# Patient Record
Sex: Male | Born: 1975 | Race: Black or African American | Hispanic: No | Marital: Single | Smoking: Current every day smoker
Health system: Southern US, Community
[De-identification: ages and names within clinical notes are randomized; demographics above are authoritative.]

---

## 1998-11-22 ENCOUNTER — Inpatient Hospital Stay (HOSPITAL_COMMUNITY): Admission: AD | Admit: 1998-11-22 | Discharge: 1998-11-25 | Payer: Self-pay | Admitting: *Deleted

## 1998-12-20 ENCOUNTER — Emergency Department (HOSPITAL_COMMUNITY): Admission: EM | Admit: 1998-12-20 | Discharge: 1998-12-20 | Payer: Self-pay | Admitting: Emergency Medicine

## 2000-06-20 ENCOUNTER — Encounter: Payer: Self-pay | Admitting: Emergency Medicine

## 2000-06-20 ENCOUNTER — Emergency Department (HOSPITAL_COMMUNITY): Admission: EM | Admit: 2000-06-20 | Discharge: 2000-06-20 | Payer: Self-pay | Admitting: Emergency Medicine

## 2009-11-09 ENCOUNTER — Inpatient Hospital Stay (HOSPITAL_COMMUNITY): Admission: EM | Admit: 2009-11-09 | Discharge: 2009-11-10 | Payer: Self-pay | Admitting: Emergency Medicine

## 2010-02-08 ENCOUNTER — Emergency Department (HOSPITAL_COMMUNITY): Admission: EM | Admit: 2010-02-08 | Discharge: 2010-02-09 | Payer: Self-pay | Admitting: Emergency Medicine

## 2010-07-02 ENCOUNTER — Emergency Department (HOSPITAL_COMMUNITY): Admission: EM | Admit: 2010-07-02 | Discharge: 2010-07-02 | Payer: Self-pay | Admitting: Emergency Medicine

## 2010-08-18 ENCOUNTER — Emergency Department (HOSPITAL_COMMUNITY)
Admission: EM | Admit: 2010-08-18 | Discharge: 2010-08-18 | Payer: Self-pay | Source: Home / Self Care | Admitting: Emergency Medicine

## 2010-10-31 LAB — BASIC METABOLIC PANEL
BUN: 11 mg/dL (ref 6–23)
CO2: 27 mEq/L (ref 19–32)
Calcium: 9.6 mg/dL (ref 8.4–10.5)
Chloride: 103 mEq/L (ref 96–112)
Creatinine, Ser: 1.14 mg/dL (ref 0.4–1.5)
GFR calc Af Amer: >60 mL/min (ref >60)
GFR calc non Af Amer: >60 mL/min (ref >60)
Glucose, Bld: 85 mg/dL (ref 70–99)
Potassium: 4.3 mEq/L (ref 3.5–5.1)
Sodium: 138 mEq/L (ref 135–145)

## 2010-10-31 LAB — DIFFERENTIAL
Basophils Absolute: 0 10*3/uL (ref 0.0–0.1)
Basophils Relative: 1 % (ref 0–1)
Eosinophils Absolute: 0.1 10*3/uL (ref 0.0–0.7)
Eosinophils Relative: 1 % (ref 0–5)
Lymphocytes Relative: 30 % (ref 12–46)
Lymphs Abs: 1.7 10*3/uL (ref 0.7–4.0)
Monocytes Absolute: 0.3 10*3/uL (ref 0.1–1.0)
Monocytes Relative: 6 % (ref 3–12)
Neutro Abs: 3.5 10*3/uL (ref 1.7–7.7)
Neutrophils Relative %: 62 % (ref 43–77)

## 2010-10-31 LAB — CBC
HCT: 38.6 % — ABNORMAL LOW (ref 39.0–52.0)
Hemoglobin: 13.3 g/dL (ref 13.0–17.0)
MCH: 33.9 pg (ref 26.0–34.0)
MCHC: 34.4 g/dL (ref 30.0–36.0)
MCV: 98.5 fL (ref 78.0–100.0)
Platelets: 174 10*3/uL (ref 150–400)
RBC: 3.92 MIL/uL — ABNORMAL LOW (ref 4.22–5.81)
RDW: 11.9 % (ref 11.5–15.5)
WBC: 5.7 10*3/uL (ref 4.0–10.5)

## 2010-10-31 LAB — ETHANOL: Alcohol, Ethyl (B): <5 mg/dL (ref 0–10)

## 2010-10-31 LAB — RAPID URINE DRUG SCREEN, HOSP PERFORMED
Amphetamines: NOT DETECTED
Barbiturates: NOT DETECTED
Benzodiazepines: NOT DETECTED
Cocaine: POSITIVE — AB
Opiates: NOT DETECTED
Tetrahydrocannabinol: POSITIVE — AB

## 2010-10-31 LAB — TRICYCLICS SCREEN, URINE: TCA Scrn: NOT DETECTED

## 2010-11-07 LAB — URINE DRUGS OF ABUSE SCREEN W ALC, ROUTINE (REF LAB)
Amphetamine Screen, Ur: NEGATIVE
Barbiturate Quant, Ur: NEGATIVE
Benzodiazepines.: NEGATIVE
Cocaine Metabolites: NEGATIVE
Creatinine,U: 209.2 mg/dL
Ethyl Alcohol: <10 mg/dL (ref <10)
Marijuana Metabolite: POSITIVE — AB
Methadone: NEGATIVE
Opiate Screen, Urine: NEGATIVE
Phencyclidine (PCP): NEGATIVE
Propoxyphene: NEGATIVE

## 2010-11-07 LAB — COMPREHENSIVE METABOLIC PANEL
ALT: 16 U/L (ref 0–53)
AST: 22 U/L (ref 0–37)
Albumin: 4 g/dL (ref 3.5–5.2)
Alkaline Phosphatase: 54 U/L (ref 39–117)
BUN: 8 mg/dL (ref 6–23)
CO2: 24 mEq/L (ref 19–32)
Calcium: 9.5 mg/dL (ref 8.4–10.5)
Chloride: 105 mEq/L (ref 96–112)
Creatinine, Ser: 1.03 mg/dL (ref 0.4–1.5)
GFR calc Af Amer: >60 mL/min (ref >60)
GFR calc non Af Amer: >60 mL/min (ref >60)
Glucose, Bld: 141 mg/dL — ABNORMAL HIGH (ref 70–99)
Potassium: 4 mEq/L (ref 3.5–5.1)
Sodium: 136 mEq/L (ref 135–145)
Total Bilirubin: 0.7 mg/dL (ref 0.3–1.2)
Total Protein: 7.2 g/dL (ref 6.0–8.3)

## 2010-11-07 LAB — CBC
HCT: 40 % (ref 39.0–52.0)
Hemoglobin: 13.5 g/dL (ref 13.0–17.0)
MCHC: 33.7 g/dL (ref 30.0–36.0)
MCV: 99.8 fL (ref 78.0–100.0)
Platelets: 195 10*3/uL (ref 150–400)
RBC: 4.01 MIL/uL — ABNORMAL LOW (ref 4.22–5.81)
RDW: 12.2 % (ref 11.5–15.5)
WBC: 6.1 10*3/uL (ref 4.0–10.5)

## 2010-11-07 LAB — CARDIAC PANEL(CRET KIN+CKTOT+MB+TROPI)
CK, MB: 0.7 ng/mL (ref 0.3–4.0)
Relative Index: 0.5 (ref 0.0–2.5)
Total CK: 153 U/L (ref 7–232)
Troponin I: <0.01 ng/mL (ref 0.00–0.06)

## 2010-11-07 LAB — TSH: TSH: 0.876 u[IU]/mL (ref 0.350–4.500)

## 2010-11-07 LAB — THC (MARIJUANA), URINE, CONFIRMATION: Marijuana, Ur-Confirmation: 24 NG/ML — ABNORMAL HIGH

## 2014-08-07 ENCOUNTER — Emergency Department (HOSPITAL_COMMUNITY)
Admission: EM | Admit: 2014-08-07 | Discharge: 2014-08-07 | Disposition: A | Discharge status: Home / Self Care | Payer: Self-pay | Attending: Emergency Medicine | Admitting: Emergency Medicine

## 2014-08-07 ENCOUNTER — Encounter (HOSPITAL_COMMUNITY): Payer: Self-pay | Admitting: *Deleted

## 2014-08-07 ENCOUNTER — Emergency Department (HOSPITAL_COMMUNITY): Payer: Self-pay

## 2014-08-07 DIAGNOSIS — R1012 Left upper quadrant pain: Secondary | ICD-10-CM | POA: Insufficient documentation

## 2014-08-07 DIAGNOSIS — Z720 Tobacco use: Secondary | ICD-10-CM | POA: Insufficient documentation

## 2014-08-07 DIAGNOSIS — R079 Chest pain, unspecified: Secondary | ICD-10-CM | POA: Insufficient documentation

## 2014-08-07 DIAGNOSIS — R200 Anesthesia of skin: Secondary | ICD-10-CM | POA: Insufficient documentation

## 2014-08-07 DIAGNOSIS — H538 Other visual disturbances: Secondary | ICD-10-CM | POA: Insufficient documentation

## 2014-08-07 DIAGNOSIS — H539 Unspecified visual disturbance: Secondary | ICD-10-CM

## 2014-08-07 LAB — BASIC METABOLIC PANEL
ANION GAP: 9 (ref 5–15)
BUN: 11 mg/dL (ref 6–23)
CO2: 24 mmol/L (ref 19–32)
Calcium: 9.2 mg/dL (ref 8.4–10.5)
Chloride: 103 mEq/L (ref 96–112)
Creatinine, Ser: 1.04 mg/dL (ref 0.50–1.35)
Glucose, Bld: 95 mg/dL (ref 70–99)
POTASSIUM: 3.9 mmol/L (ref 3.5–5.1)
Sodium: 136 mmol/L (ref 135–145)

## 2014-08-07 LAB — CBC
HCT: 37.7 % — ABNORMAL LOW (ref 39.0–52.0)
Hemoglobin: 12.6 g/dL — ABNORMAL LOW (ref 13.0–17.0)
MCH: 31 pg (ref 26.0–34.0)
MCHC: 33.4 g/dL (ref 30.0–36.0)
MCV: 92.6 fL (ref 78.0–100.0)
Platelets: 188 10*3/uL (ref 150–400)
RBC: 4.07 MIL/uL — ABNORMAL LOW (ref 4.22–5.81)
RDW: 11.5 % (ref 11.5–15.5)
WBC: 4.6 10*3/uL (ref 4.0–10.5)

## 2014-08-07 LAB — I-STAT TROPONIN, ED
Troponin i, poc: 0 ng/mL (ref 0.00–0.08)
Troponin i, poc: 0 ng/mL (ref 0.00–0.08)

## 2014-08-07 LAB — POC OCCULT BLOOD, ED: Fecal Occult Bld: NEGATIVE

## 2014-08-07 MED ORDER — METHOCARBAMOL 500 MG PO TABS
500.0000 mg | ORAL_TABLET | Freq: Two times a day (BID) | ORAL | Status: DC
Start: 2014-08-07 — End: 2023-11-05

## 2014-08-07 MED ORDER — IOHEXOL 350 MG/ML SOLN
80.0000 mL | Freq: Once | INTRAVENOUS | Status: AC | PRN
  Administered 2014-08-07: 100 mL via INTRAVENOUS

## 2014-08-07 MED ORDER — OMEPRAZOLE 20 MG PO CPDR: 20.0000 mg | DELAYED_RELEASE_CAPSULE | Freq: Every day | ORAL | Status: DC

## 2014-08-07 NOTE — ED Notes (Signed)
Pt requesting food and drink; informed patient of NPO status until CT has resulted

## 2014-08-07 NOTE — ED Notes (Signed)
Pt to ED via [facility] c/o dizziness and chest pain. Pt was walking from the store when he began feeling dizzy and sharp chest pains simultaneously. Reports similar episode two weeks ago, relieved with aspirin. EMS gave 324mg  asa and Nitrox 2; pt denies pain at this time. EMS VS 127/70; hr 76; cbg 113
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
## 2014-08-07 NOTE — Discharge Instructions (Signed)
1. Medications: robaxin for muscle spasms, usual home medications 2. Treatment: rest, drink plenty of fluids, gentle stretching 3. Follow Up: Please followup with your primary doctor in 3 days for discussion of your diagnoses and further evaluation after today's visit; if you do not have a primary care doctor use the resource guide provided to find one; Please return to the ER for return of CP, or other concerning symptoms   Chest Pain (Nonspecific) It is often hard to give a specific diagnosis for the cause of chest pain. There is always a chance that your pain could be related to something serious, such as a heart attack or a blood clot in the lungs. You need to follow up with your health care provider for further evaluation. CAUSES   Heartburn.  Pneumonia or bronchitis.  Anxiety or stress.  Inflammation around your heart (pericarditis) or lung (pleuritis or pleurisy).  A blood clot in the lung.  A collapsed lung (pneumothorax). It can develop suddenly on its own (spontaneous pneumothorax) or from trauma to the chest.  Shingles infection (herpes zoster virus). The chest wall is composed of bones, muscles, and cartilage. Any of these can be the source of the pain.  The bones can be bruised by injury.  The muscles or cartilage can be strained by coughing or overwork.  The cartilage can be affected by inflammation and become sore (costochondritis). DIAGNOSIS  Lab tests or other studies may be needed to find the cause of your pain. Your health care provider may have you take a test called an ambulatory electrocardiogram (ECG). An ECG records your heartbeat patterns over a 24-hour period. You may also have other tests, such as:  Transthoracic echocardiogram (TTE). During echocardiography, sound waves are used to evaluate how blood flows through your heart.  Transesophageal echocardiogram (TEE).  Cardiac monitoring. This allows your health care provider to monitor your heart rate and  rhythm in real time.  Holter monitor. This is a portable device that records your heartbeat and can help diagnose heart arrhythmias. It allows your health care provider to track your heart activity for several days, if needed.  Stress tests by exercise or by giving medicine that makes the heart beat faster. TREATMENT   Treatment depends on what may be causing your chest pain. Treatment may include:  Acid blockers for heartburn.  Anti-inflammatory medicine.  Pain medicine for inflammatory conditions.  Antibiotics if an infection is present.  You may be advised to change lifestyle habits. This includes stopping smoking and avoiding alcohol, caffeine, and chocolate.  You may be advised to keep your head raised (elevated) when sleeping. This reduces the chance of acid going backward from your stomach into your esophagus. Most of the time, nonspecific chest pain will improve within 2-3 days with rest and mild pain medicine.  HOME CARE INSTRUCTIONS   If antibiotics were prescribed, take them as directed. Finish them even if you start to feel better.  For the next few days, avoid physical activities that bring on chest pain. Continue physical activities as directed.  Do not use any tobacco products, including cigarettes, chewing tobacco, or electronic cigarettes.  Avoid drinking alcohol.  Only take medicine as directed by your health care provider.  Follow your health care provider's suggestions for further testing if your chest pain does not go away.  Keep any follow-up appointments you made. If you do not go to an appointment, you could develop lasting (chronic) problems with pain. If there is any problem keeping an appointment,  call to reschedule. SEEK MEDICAL CARE IF:   Your chest pain does not go away, even after treatment.  You have a rash with blisters on your chest.  You have a fever. SEEK IMMEDIATE MEDICAL CARE IF:   You have increased chest pain or pain that spreads to  your arm, neck, jaw, back, or abdomen.  You have shortness of breath.  You have an increasing cough, or you cough up blood.  You have severe back or abdominal pain.  You feel nauseous or vomit.  You have severe weakness.  You faint.  You have chills. This is an emergency. Do not wait to see if the pain will go away. Get medical help at once. Call your local emergency services (911 in U.S.). Do not drive yourself to the hospital. MAKE SURE YOU:   Understand these instructions.  Will watch your condition.  Will get help right away if you are not doing well or get worse. Document Released: 05/10/2005 Document Revised: 08/05/2013 Document Reviewed: 03/05/2008 [facility] Patient Information 2015 [place], [place]. This information is not intended to replace advice given to you by your health care provider. Make sure you discuss any questions you have with your health care provider.   Emergency Department Resource Guide 1) Find a Doctor and Pay Out of Pocket Although you won't have to find out who is covered by your insurance plan, it is a good idea to ask around and get recommendations. You will then need to call the office and see if the doctor you have chosen will accept you as a new patient and what types of options they offer for patients who are self-pay. Some doctors offer discounts or will set up payment plans for their patients who do not have insurance, but you will need to ask so you aren't surprised when you get to your appointment.  2) Contact Your Local Health Department Not all health departments have doctors that can see patients for sick visits, but many do, so it is worth a call to see if yours does. If you don't know where your local health department is, you can check in your phone book. The CDC also has a tool to help you locate your state's health department, and many state websites also have listings of all of their local health departments.  3) Find a Walk-in  Clinic If your illness is not likely to be very severe or complicated, you may want to try a walk in clinic. These are popping up all over the country in pharmacies, drugstores, and shopping centers. They're usually staffed by nurse practitioners or physician assistants that have been trained to treat common illnesses and complaints. They're usually fairly quick and inexpensive. However, if you have serious medical issues or chronic medical problems, these are probably not your best option.  No Primary Care Doctor: - Call Health Connect at  [phone] - they can help you locate a primary care doctor that  accepts your insurance, provides certain services, etc. - Physician Referral Service- [phone]  Chronic Pain Problems: Organization         Address  Phone   Notes  [facility]  [phone] Patients need to be referred by their primary care doctor.   Medication Assistance: Organization         Address  Phone   Notes  [facility] Medication [facility] [address] [place] [place], [place] [address] [phone] --Must be a resident of [facility] --  Must have NO insurance coverage whatsoever (no Medicaid/ Medicare, etc.) -- The pt. MUST have a primary care doctor that directs their care regularly and follows them in the community   Med[facility]ist  [phone]) [phone]   [employer]  [phone]    Agencies that provide inexpensive medical care: Organization         Address  Phone   Notes  [facility]  [phone]   [facility]    [phone]   [facility] [address] [place], [place] [id number] [phone]   [facility] [place] [address], [place] [phone]) [phone]   Planned Parenthood    [phone]   [facility]    [phone]   [facility] and [facility]  [address], [place] Phone:  [phone], Fax:  [phone]) [phone] Hours  of Operation:  9 am - 6 pm, M-F.  Also accepts Medicaid/Medicare and self-pay.  [facility]  [address], [place] Phone: [phone]) [phone], Fax: [phone]. Hours of Operation:  8:30 am - 5:30 pm, M-F.  Also accepts Medicaid and self-pay.  [facility] [place] [address], [place] Phone: [phone]) [phone]   [facility] [address], [place], [place] [phone], Ext. [phone] Mondays & Thursdays: 7-9 AM.  First 15 patients are seen on a first come, first serve basis.    Medicaid-accepting [facility] Providers:  Organization         Address  Phone   Notes  [facility] [address], [place] [phone]) [phone] Also accepts self-pay patients.  [facility] [address] [place], [place]  [phone]   [facility] [address], [place] [phone]   [facility] [address] [place] [address], [place] [phone]   Renaye RakersVeita Bland [address], [place]   [phone]) [phone] Only accepts [place] Access [place] patients after they have their name applied to their card.   Self-Pay (no insurance) in [facility]:  Organization         Address  Phone   Notes  Sickle Cell Patients, [facility] [address], [place] [phone]   [facility] [address], [place] [phone]   [facility] [place]  [address], [place] [phone]   [facility]/Dr. Osei-Bonsu  [address] [place] [address], [place] or [phone], [place] [phone] Phone number for both [place] and [place] locations is the same.  [facility] [address], [place] [phone]   [facility]  [address] [place] [address], [place] or [address] [phone]   [facility] [address], [place] [phone]) [phone], phone; [phone], fax Sees patients 1st and 3rd Saturday of every month.  Must not qualify for public or private insurance (i.e. Medicaid, Medicare, [place] Health Choice, Veterans' Benefits)  Household income should be no more than 200% of the poverty level The clinic cannot treat you if you are pregnant or think you are pregnant  Sexually transmitted diseases are not treated at the clinic.    Dental Care: Organization         Address  Phone  Notes  [facility] Department of [facility] [address] [place], [place] [phone] Accepts children up to age 69 who are enrolled in [place] or [place] Health Choice; pregnant women with a Medicaid card; and children who have applied for Medicaid or [place] Health Choice, but were declined, whose parents can pay a reduced fee at time of service.  [facility] Department of [facility]  [address], [place] [phone] Accepts children up to age 62 who are enrolled in [place] or [place] Health Choice; pregnant women with a Medicaid card; and children who have applied for Medicaid or [place] Health Choice, but were declined, whose parents can pay a reduced fee at time of service.  [facility]  [address] [place], [place] [phone] Patients are seen by appointment only. Walk-ins are not accepted. [facility] will see patients 30 years of age and older. Monday - Tuesday (8am-5pm) Most Wednesdays (8:30-5pm) $30 per visit, cash only  [facility] Access [facility]  [address], [facility] [phone] Patients are seen by appointment only. Walk-ins are not accepted. [facility] will see patients 9 years of age and older. One Wednesday Evening (Monthly: Volunteer Based).  $30 per visit, cash only  [employer]  [phone] for adults; Children under age 73, call Graduate  Pediatric Dentistry at [phone]. Children aged 52-14, please call [phone] to request a pediatric application.  Dental services are provided in all areas of dental care including fillings, crowns and bridges, complete and partial dentures, implants, gum treatment, root canals, and extractions. Preventive care is also provided. Treatment is provided to both adults and children. Patients are selected via a lottery and there is often a waiting list.   [facility] [address], [place]  [phone] [email or web address]   [facility] [address] [place], [place] [phone], Ext. [phone] Second and Fourth Thursday of each month, opens at 6:30 AM; Clinic ends at 9 AM.  Patients are seen on a first-come first-served basis, and a limited number are seen during each clinic.   [facility]  [address] [place], [place] [phone]   Eligibility Requirements You must have lived in [place], [place], or [place] counties for at least the last three months.   You cannot be eligible for state or federal sponsored [place], including [employer], [place], or [employer].   You generally cannot be eligible for healthcare insurance through your employer.    How to apply: Eligibility screenings are held every Tuesday and Wednesday afternoon from 1:00 pm until 4:00 pm. You do not need an appointment for the interview!  [facility] [address] [place], [place] [phone]   [facility] Health Department  [phone]   [facility] Health Department  [phone]   [facility] Health Department  [phone]    Behavioral Health Resources in the Community: Intensive Outpatient Programs Organization         Address  Phone  Notes  [facility] [address], [place], [place] [phone]   [facility] Outpatient [address] [place], [place]  [phone]   ADS: Alcohol [facility] Drug Svcs [address] [place], [place]  [phone]   [facility] [address],  [place], [place] [phone] or [phone]   Substance Abuse Resources Organization  Address  Phone  Notes  Alcohol and Drug Services  [phone]   [place]  [phone]   [facility] [place]  [phone]   Chinita Pester  [phone]   Residential & Outpatient Substance Abuse Program  [phone]   Psychological Services Organization         Address  Phone  Notes  [facility]  [place]  [phone]   [place] [address] [place] or [phone]    Mobile Crisis Teams Organization         Address  Phone  Notes  [facility], Mobile Crisis Care Unit  [phone]   [facility]  [address] [place], [place]   [place] Levels [address] [place] [phone]    Self-Help/Support Groups Organization         Address  Phone             Notes  [place] of [place] - variety of support groups  [place] Call for more information  Narcotics Anonymous (NA), [facility] [address], [employer] [place]  2 meetings at this location   Special educational needs teacher         Address  Phone  Notes  ASAP Residential Treatment [place],    [place]  [phone]   [facility]  [address], [place] [address], [place], [place]   [place], [place] [phone] Admissions: 8am-3pm M-F  Incentives Substance [place] [address]    [place], [place] [phone]   [facility] [address] [place], [place], [place]   [facility] [address]  [place], [place]   Insight Programs - Intensive Outpatient [address] Kristeen Mans [address], [place], [place]   [facility] [address]  [place], [place] [phone] or [phone]   [facility] (RTS) [address], [place], [place] Accepts Medicaid  [facility] [place] [address],  [place] [phone] Substance Abuse/Addiction Treatment   [facility] Organization         Address  Phone  Notes  [facility]  [phone]   Domenic Schwab, PhD [address] Arlis [place], [place]   2395[address]23[address] or [phone]   [place], [place] [phone]   [facility] [address] [place], [place] [phone] Insurance/Medicaid/sponsorship through [facility] and Families [address] [place]                                    [place], [place] [phone] Rafael Hernandez [address], [place] [phone]    Dr. Adele Schilder  [phone]   [facility]. 1) [address], [place] 2) [place] 3)  [place] [address], [place] [phone]  [phone]   [place] [phone] or [phone] (After Hours)

## 2014-08-23 ENCOUNTER — Emergency Department (HOSPITAL_COMMUNITY)
Admission: EM | Admit: 2014-08-23 | Discharge: 2014-08-23 | Disposition: A | Discharge status: Home / Self Care | Payer: Self-pay

## 2014-08-24 ENCOUNTER — Emergency Department (HOSPITAL_COMMUNITY)
Admission: EM | Admit: 2014-08-24 | Discharge: 2014-08-25 | Disposition: A | Discharge status: Other Acute Inpatient Hospital | Payer: Self-pay | Attending: Emergency Medicine | Admitting: Emergency Medicine

## 2014-08-24 ENCOUNTER — Encounter (HOSPITAL_COMMUNITY): Payer: Self-pay | Admitting: Emergency Medicine

## 2014-08-24 DIAGNOSIS — Z720 Tobacco use: Secondary | ICD-10-CM | POA: Insufficient documentation

## 2014-08-24 DIAGNOSIS — R45851 Suicidal ideations: Secondary | ICD-10-CM

## 2014-08-24 DIAGNOSIS — F200 Paranoid schizophrenia: Secondary | ICD-10-CM | POA: Diagnosis present

## 2014-08-24 DIAGNOSIS — Z9119 Patient's noncompliance with other medical treatment and regimen: Secondary | ICD-10-CM | POA: Insufficient documentation

## 2014-08-24 LAB — ETHANOL: Alcohol, Ethyl (B): <5 mg/dL (ref 0–9)

## 2014-08-24 LAB — CBC WITH DIFFERENTIAL/PLATELET
BASOS ABS: 0 10*3/uL (ref 0.0–0.1)
Basophils Relative: 0 % (ref 0–1)
EOS ABS: 0 10*3/uL (ref 0.0–0.7)
Eosinophils Relative: 0 % (ref 0–5)
HCT: 41.8 % (ref 39.0–52.0)
HEMOGLOBIN: 14.1 g/dL (ref 13.0–17.0)
Lymphocytes Relative: 36 % (ref 12–46)
Lymphs Abs: 1.9 10*3/uL (ref 0.7–4.0)
MCH: 32 pg (ref 26.0–34.0)
MCHC: 33.7 g/dL (ref 30.0–36.0)
MCV: 94.8 fL (ref 78.0–100.0)
MONO ABS: 0.3 10*3/uL (ref 0.1–1.0)
MONOS PCT: 5 % (ref 3–12)
Neutro Abs: 3 10*3/uL (ref 1.7–7.7)
Neutrophils Relative %: 59 % (ref 43–77)
Platelets: 179 10*3/uL (ref 150–400)
RBC: 4.41 MIL/uL (ref 4.22–5.81)
RDW: 12.5 % (ref 11.5–15.5)
WBC: 5.2 10*3/uL (ref 4.0–10.5)

## 2014-08-24 LAB — RAPID URINE DRUG SCREEN, HOSP PERFORMED
Amphetamines: NOT DETECTED
BENZODIAZEPINES: NOT DETECTED
Barbiturates: NOT DETECTED
Cocaine: NOT DETECTED
Opiates: NOT DETECTED
Tetrahydrocannabinol: NOT DETECTED

## 2014-08-24 LAB — I-STAT CHEM 8, ED
BUN: 14 mg/dL (ref 6–23)
Calcium, Ion: 1.21 mmol/L (ref 1.12–1.23)
Chloride: 103 mEq/L (ref 96–112)
Creatinine, Ser: 0.8 mg/dL (ref 0.50–1.35)
GLUCOSE: 109 mg/dL — AB (ref 70–99)
HCT: 48 % (ref 39.0–52.0)
HEMOGLOBIN: 16.3 g/dL (ref 13.0–17.0)
Potassium: 3.8 mmol/L (ref 3.5–5.1)
Sodium: 140 mmol/L (ref 135–145)
TCO2: 22 mmol/L (ref 0–100)

## 2014-08-24 MED ORDER — ZIPRASIDONE MESYLATE 20 MG IM SOLR
20.0000 mg | Freq: Once | INTRAMUSCULAR | Status: AC
  Administered 2014-08-24: 20 mg via INTRAMUSCULAR
  Filled 2014-08-24: qty 20

## 2014-08-24 MED ORDER — NICOTINE 21 MG/24HR TD PT24: 21.0000 mg | MEDICATED_PATCH | Freq: Every day | TRANSDERMAL | Status: DC

## 2014-08-24 MED ORDER — IBUPROFEN 200 MG PO TABS: 600.0000 mg | ORAL_TABLET | Freq: Three times a day (TID) | ORAL | Status: DC | PRN

## 2014-08-24 MED ORDER — ONDANSETRON HCL 4 MG PO TABS: 4.0000 mg | ORAL_TABLET | Freq: Three times a day (TID) | ORAL | Status: DC | PRN

## 2014-08-24 MED ORDER — BENZTROPINE MESYLATE 1 MG PO TABS
0.5000 mg | ORAL_TABLET | Freq: Two times a day (BID) | ORAL | Status: DC
  Administered 2014-08-24 – 2014-08-25 (×2): 0.5 mg via ORAL
  Filled 2014-08-24 (×2): qty 1

## 2014-08-24 MED ORDER — HALOPERIDOL 5 MG PO TABS
5.0000 mg | ORAL_TABLET | Freq: Two times a day (BID) | ORAL | Status: DC
  Administered 2014-08-24 – 2014-08-25 (×2): 5 mg via ORAL
  Filled 2014-08-24 (×3): qty 1

## 2014-08-24 NOTE — ED Notes (Signed)
Patient met alert; cuddling in bed wrapping self with blanket. Appear anxious and agitated. Patient responded only the writers greetings then covered his face with blanket. This writer left and returned to the patient's room after 10 minutes. Patient appear sleepy and snoring; did not respond to his call. Will continue to monitor patient.

## 2014-08-24 NOTE — BH Assessment (Signed)
Tele Assessment Note Information provided by pt limited due to drowsiness after being given Geodon  Matthew Carney is an 39 y.o. male. Presenting to ED under IVC taken out by his mother. Per IVC pt has hx of schizophrenia and bipolar, has not been taking his medication, not caring for himself and is paranoid. In ED pt was given Geodon as he was begging the officers to kill him and was refusing to get into the bed.He told EDP people were out to kill him and had been performing with craft on him. Pt was drowsy with flat affect at time of assessment. Pt reports he does not know how or why he was brought to ED. He reports current stressors include recent death of loved on that he did not want to name, and not having a place to stay. "I stay place to place." He reports he currently has no place to go. Pt reports he struggles with depression feeling irritable, loss of pleasure, loss of motivation, and sadness. Pt reports anxious feelings at times and reports panic attacks in the past. Pt denies SI/HI, AVH. However exhibited SI and manic behavior in ED prior to being given Geodon. Pt reports he was inpt at [facility] years ago and was followed by [facility] but has not been in about a year. He denies SA and self-harm.  Denies family hx of MH or SA problems. Denies hx of abuse or trauma.   Axis I: 295.90 Schizophrenia   300.00 Unspecified Anxiety Disorder     Axis II: Deferred Axis III: History reviewed. No pertinent past medical history. Axis IV: housing problems, occupational problems, other psychosocial or environmental problems and problems with access to health care services Axis V: 21-30 behavior considerably influenced by delusions or hallucinations OR serious impairment in judgment, communication OR inability to function in almost all areas  Past Medical History: History reviewed. No pertinent past medical history.  History reviewed. No pertinent past surgical history.  Family History: History reviewed. No  pertinent family history.  Social History:  reports that he has been smoking.  He does not have any smokeless tobacco history on file. He reports that he drinks alcohol. He reports that he does not use illicit drugs.  Additional Social History:  Alcohol / Drug Use Pain Medications: SEE PTA Prescriptions: SEE PTA, IVC reports pt has not been taking medication. Pt reports he has not taken medication in about a year Over the Counter: SEE PTA History of alcohol / drug use?: No history of alcohol / drug abuse Longest period of sobriety (when/how long):  (NA) Negative Consequences of Use:  (Denies) Withdrawal Symptoms:  (denies)  CIWA: CIWA-Ar BP: (!) 160/102 mmHg Pulse Rate: 105 COWS:    PATIENT STRENGTHS: (choose at least two) Average or above average intelligence Supportive family/friends  Allergies: No Known Allergies  Home Medications:  (Not in a hospital admission)  OB/GYN Status:  No LMP for male patient.  General Assessment Data Location of Assessment: [facility] Is this a Tele or Face-to-Face Assessment?: Face-to-Face Is this an Initial Assessment or a Re-assessment for this encounter?: Initial Assessment Living Arrangements: Other (Comment) ("place to place") Can pt return to current living arrangement?: Yes Admission Status: Involuntary Is patient capable of signing voluntary admission?: No Transfer from: Unknown Referral Source: Self/Family/Friend     [facility] Crisis Care Plan Living Arrangements: Other (Comment) ("place to place") Name of Psychiatrist: Vesta Mixer but reports has not been attending Name of Therapist: NA  Education Status Is patient currently in  school?: No Current Grade: NA Highest grade of school patient has completed: 8 Name of school: NA Contact person: NA  Risk to self with the past 6 months Suicidal Ideation: Yes-Currently Present Suicidal Intent: Yes-Currently Present Is patient at risk for suicide?: Yes Suicidal Plan?: Yes-Currently  Present Specify Current Suicidal Plan: asked police to shoot him Access to Means: No What has been your use of drugs/alcohol within the last 12 months?: reports none,UDS and BAL pending Previous Attempts/Gestures: No How many times?: 0 Other Self Harm Risks: none Triggers for Past Attempts: None known Intentional Self Injurious Behavior: None Family Suicide History: No Recent stressful life event(s):  (unstable housing) Persecutory voices/beliefs?: Yes Depression: Yes Depression Symptoms: Feeling angry/irritable, Loss of interest in usual pleasures (loss of motivation) Substance abuse history and/or treatment for substance abuse?: No Suicide prevention information given to non-admitted patients: Yes  Risk to Others within the past 6 months Homicidal Ideation: No Thoughts of Harm to Others: No Current Homicidal Intent: No Current Homicidal Plan: No Access to Homicidal Means: No Identified Victim: none History of harm to others?: No Assessment of Violence: None Noted Violent Behavior Description: none Does patient have access to weapons?: No Criminal Charges Pending?: No Does patient have a court date: No  Psychosis Hallucinations:  (denies at this time) Delusions: Persecutory  Mental Status Report Appear/Hygiene: In scrubs Eye Contact: Poor Motor Activity: Unremarkable Speech: Logical/coherent Level of Consciousness: Drowsy Mood: Depressed Affect: Flat Anxiety Level: Moderate Thought Processes: Coherent, Relevant Judgement: Impaired Orientation: Person, Place, Time Obsessive Compulsive Thoughts/Behaviors: None  Cognitive Functioning Concentration: Decreased Memory: Recent Impaired, Remote Intact IQ: Average Insight: Poor Impulse Control: Poor Appetite: Poor Weight Loss: 0 Weight Gain: 0 Sleep: Unable to Assess (reports does not know) Total Hours of Sleep:  (does not know) Vegetative Symptoms: Decreased grooming, Not bathing (per IVC)  ADLScreening [facility]  Assessment Services) Patient's cognitive ability adequate to safely complete daily activities?: Yes Patient able to express need for assistance with ADLs?: Yes Independently performs ADLs?: Yes (appropriate for developmental age)  Prior Inpatient Therapy Prior Inpatient Therapy: Yes Prior Therapy Dates: "years ago" [facility] Reason for Treatment: pt does not recall  Prior Outpatient Therapy Prior Outpatient Therapy: Yes Prior Therapy Dates: stopped aout a year ago Prior Therapy Facilty/Provider(s): [facility]  Reason for Treatment: medication management  ADL Screening (condition at time of admission) Patient's cognitive ability adequate to safely complete daily activities?: Yes Is the patient deaf or have difficulty hearing?: No Does the patient have difficulty seeing, even when wearing glasses/contacts?: No Does the patient have difficulty concentrating, remembering, or making decisions?: Yes Patient able to express need for assistance with ADLs?: Yes Does the patient have difficulty dressing or bathing?: No Independently performs ADLs?: Yes (appropriate for developmental age) Does the patient have difficulty walking or climbing stairs?: No Weakness of Legs: None Weakness of Arms/Hands: None  Home Assistive Devices/Equipment Home Assistive Devices/Equipment: None    Abuse/Neglect Assessment (Assessment to be complete while patient is alone) Physical Abuse: Denies Verbal Abuse: Denies Sexual Abuse: Denies Exploitation of patient/patient's resources: Denies Self-Neglect: Denies Values / Beliefs Cultural Requests During Hospitalization: None Spiritual Requests During Hospitalization: None   Advance Directives (For Healthcare) Does patient have an advance directive?: No Would patient like information on creating an advanced directive?: No - patient declined information    Additional Information 1:1 In Past 12 Months?: No CIRT Risk:  Yes Elopement Risk: Yes Does patient have medical clearance?: Yes     Disposition:  Per Janann August, NP am psychiatric evaluation to uphold rescind IVC and final disposition recommendations as pt was sedated at time of assessment. Per Earley Favor, and nurse reports pt will likely need inpt placement. No current [facility] beds available. TTS will seek placement.   Clista Bernhardt, [facility] Triage Specialist 08/24/2014 2:03 AM

## 2014-08-24 NOTE — Progress Notes (Addendum)
  CARE MANAGEMENT ED NOTE 08/24/2014  Patient:  Matthew Carney,Matthew Carney   Account Number:  [id number]  Date Initiated:  08/24/2014  Documentation initiated by:  Edd ArbourGIBBS,KIMBERLY  Subjective/Objective Assessment:   39 yr old self pay [place] IVC for states he is schizophrenic and paranoid carrying a knife around paranoid of the people following him.  Pt is uncooperative.  Pt states that he wants the police to take out their gun and shot him     Subjective/Objective Assessment Detail:   no pcp per pt  Pt with 3 ED visits and no admission  male at bedside Matthew Carney is aware of P4CC services and states [facility] staff can reach her to get assist for pt to go to sign up for orange card  Pt male friend at bedside Matthew Carney [phone] (not enough numbers but Matthew left before correct number obtained) states P4 CC staff can call her to help pt  [phone] also [facility]     Action/Plan:   ED CM spoke with pt about self pay pcps see notes below   Action/Plan Detail:   Anticipated DC Date:       Status Recommendation to Physician:   Result of Recommendation:    Other ED Services  Consult Working Plan    DC Planning Services  Other  Outpatient Services - Pt will follow up  PCP issues  GCCN / P4HM (established/new)    Choice offered to / List presented to:            Status of service:  Completed, signed off  ED Comments:   ED Comments Detail:  CM spoke with pt who [place]. CM discussed and provided written information for self pay pcps, importance of pcp for f/u care, www.needymeds.org, www.goodrx.com, discounted pharmacies and other [place] resources such as [facility], [facility], affordable care act,  financial assistance, DSS and  health department  Reviewed resources for [place] self pay pcps like [facility] [place], [facility] at [address], [facility], general medical clinics, [facility] urgent care plus others,  medication resources, [facility] out patient pharmacies and housing Pt voiced understanding and appreciation of resources provided  Provided [facility] contact information  Pt agreed to referral and cm completed the referral

## 2014-08-24 NOTE — ED Notes (Signed)
Patient AV hallucinations, religious preoccupation and delusions Patient SI--wants [employer] to "shoot me in the head" Patient given multiple attempts by nursing staff to cooperate Geodon given, [facility]

## 2014-08-24 NOTE — ED Notes (Signed)
Patient placed in paper scrubs Patient wanded by security Patient's belongings taken by security

## 2014-08-24 NOTE — ED Notes (Signed)
Bed: WA14 Expected date:  Expected time:  Means of arrival:  Comments: Pt getting dressed 

## 2014-08-24 NOTE — ED Notes (Signed)
TTS at bedside. 
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
## 2014-08-24 NOTE — BH Assessment (Signed)
Relayed results of assessment to Janann Augustori Burkett, NP. Per Janann Augustori Burkett, NP AM psych evaluation to uphold or rescind IVC and for final disposition recommendations due to pt sedation at time of assessment.   Informed Earley FavorGail Schulz of plan and she is in agreement with plan. TTS will send referrals while awaiting AM psyc anticipating pt will need placement due to decompensation.   Clista BernhardtNancy Arian Mcquitty, [facility] Triage Specialist 08/24/2014 1:51 AM
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
## 2014-08-24 NOTE — ED Notes (Signed)
Pt arrived to the ED under IVC paperwork.  Pt's paperwork states he is schizophrenic and paranoid.  Pt is seeing people that are not there.  Pt's paperwork states that he is carrying a knife around paranoid of the people following him.  Pt is uncooperative.  Pt sttes that he wants the police to take out their gun and shot him

## 2014-08-24 NOTE — ED Notes (Signed)
Family visiting with pt at present, Awake, alert & responsive, no distress noted, Will continue to monitor for safety.

## 2014-08-24 NOTE — ED Notes (Signed)
Patient refused vitals.

## 2014-08-25 ENCOUNTER — Inpatient Hospital Stay: Payer: Self-pay | Admitting: Psychiatry

## 2014-08-25 DIAGNOSIS — R45851 Suicidal ideations: Secondary | ICD-10-CM

## 2014-08-25 DIAGNOSIS — F209 Schizophrenia, unspecified: Secondary | ICD-10-CM

## 2014-08-25 DIAGNOSIS — F419 Anxiety disorder, unspecified: Secondary | ICD-10-CM

## 2014-08-25 MED ORDER — TRAZODONE HCL 100 MG PO TABS: 100.0000 mg | ORAL_TABLET | Freq: Every evening | ORAL | Status: DC | PRN
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
## 2014-08-25 NOTE — [facility] Counselor (Signed)
TTS Counselor faxed referrals to the following facilities in effort to locate inpt placement:  [facility]  [facility]  [facility]  [facility]  [facility]  [facility]  [facility]
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
## 2016-11-22 IMAGING — CT CT HEAD W/O CM
1 series · 16 of 29 positions shown, 20 images · non-contrast
Comparison: None

CLINICAL DATA: Acute dizziness and chest pain

EXAM:
CT HEAD WITHOUT CONTRAST
TECHNIQUE: Contiguous axial images were obtained from the base of the skull
through the vertex without contrast.

[Series 2: head 5.0 h30s · axial · 0.42mm/px · z∈[-113,+17]mm · 16 of 29 slices shown, 20 images]
[im 2/29  brain]
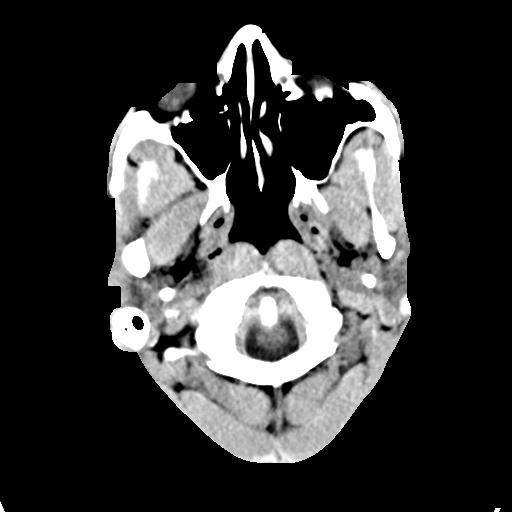
[im 2/29  bone]
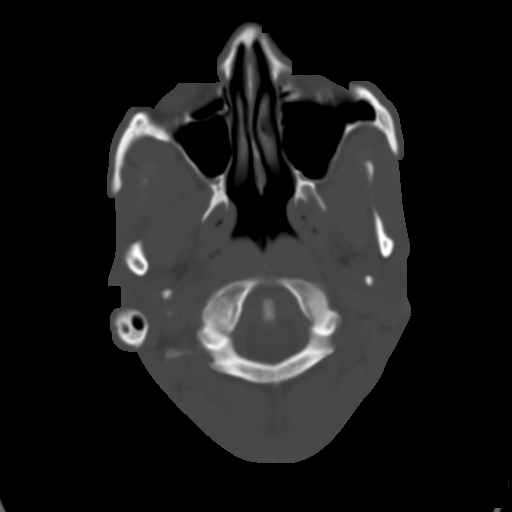
[im 4/29  brain]
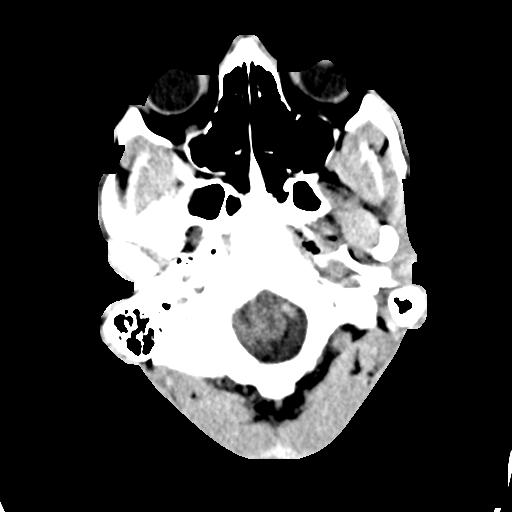
[im 6/29  brain]
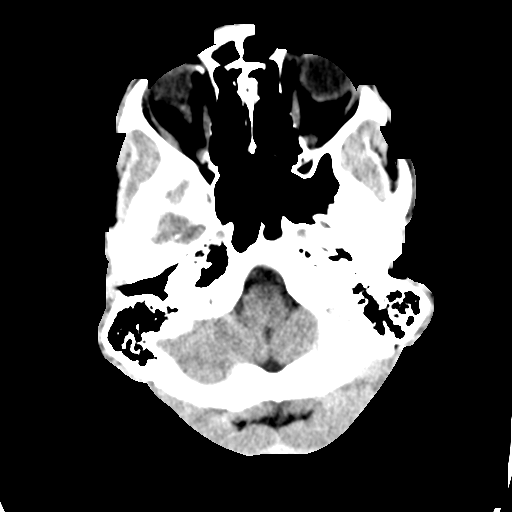
[im 7/29  brain]
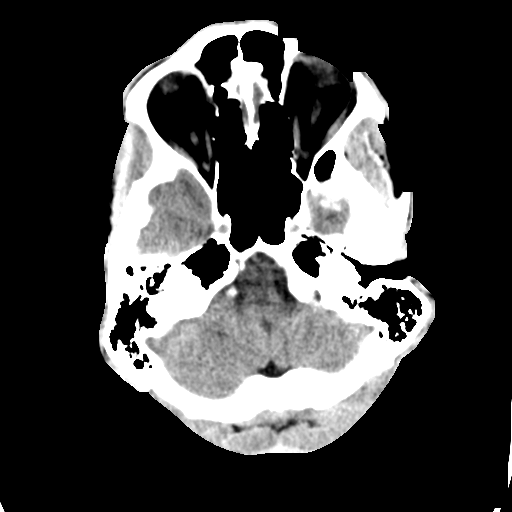
[im 9/29  brain]
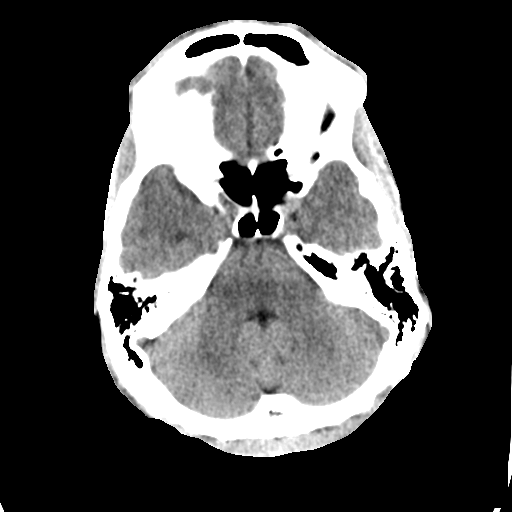
[im 9/29  bone]
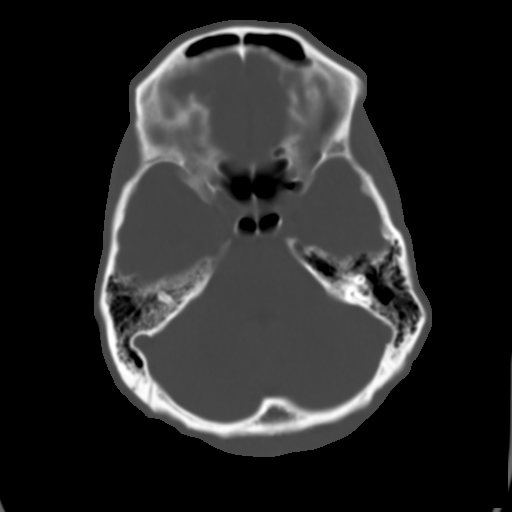
[im 11/29  brain]
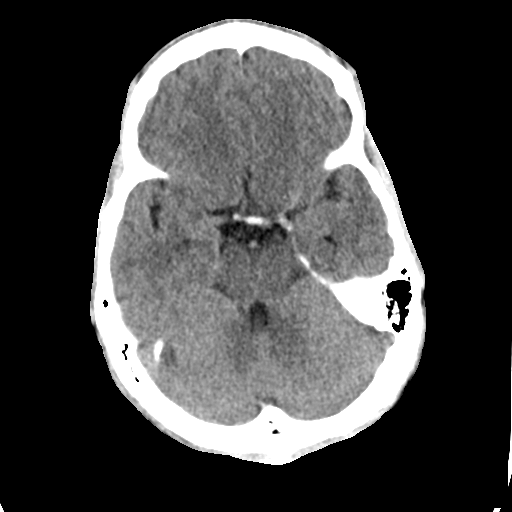
[im 12/29  brain]
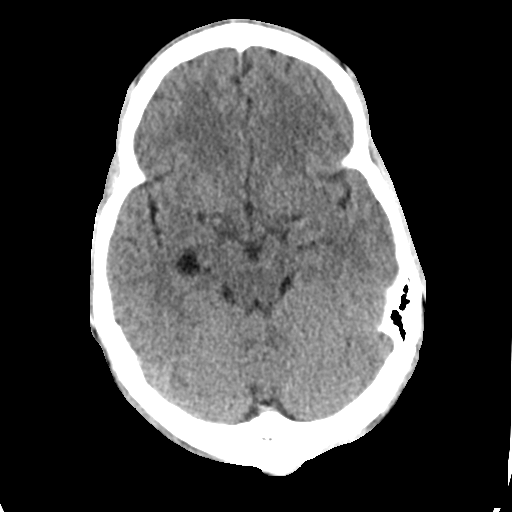
[im 14/29  brain]
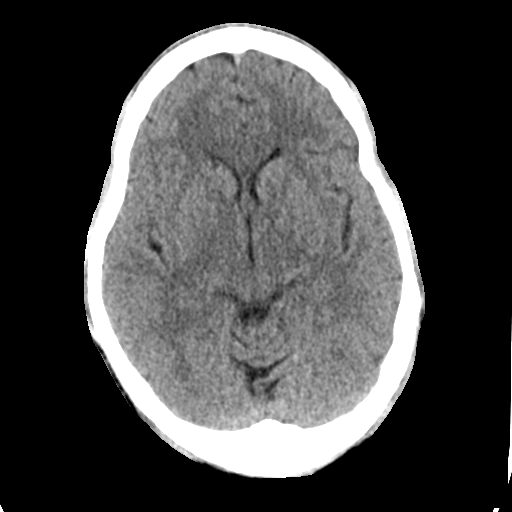
[im 16/29  brain]
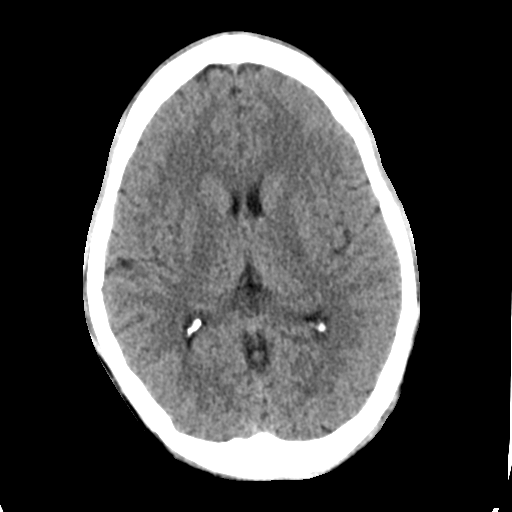
[im 16/29  bone]
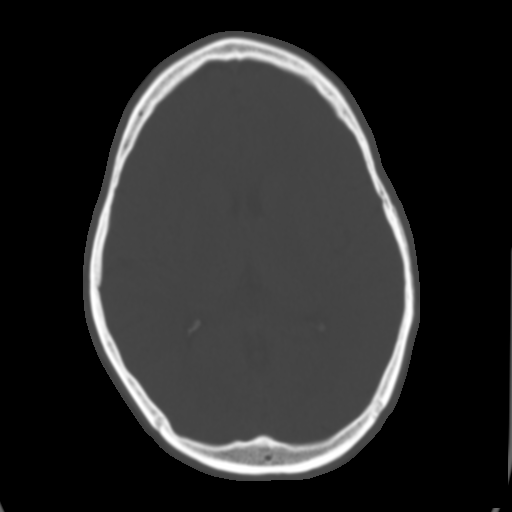
[im 18/29  brain]
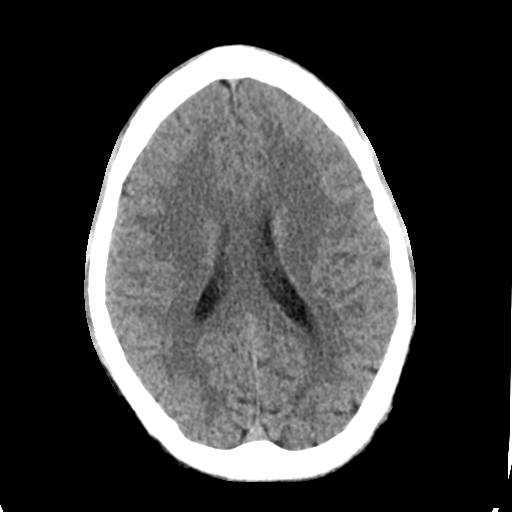
[im 19/29  brain]
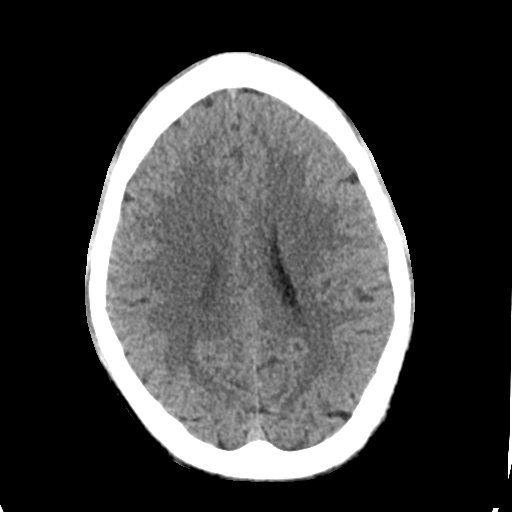
[im 21/29  brain]
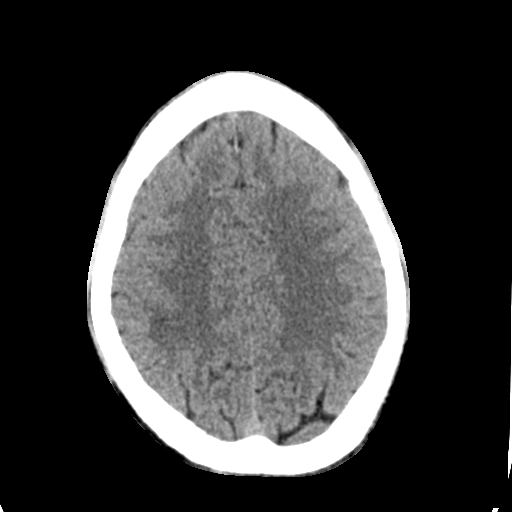
[im 23/29  brain]
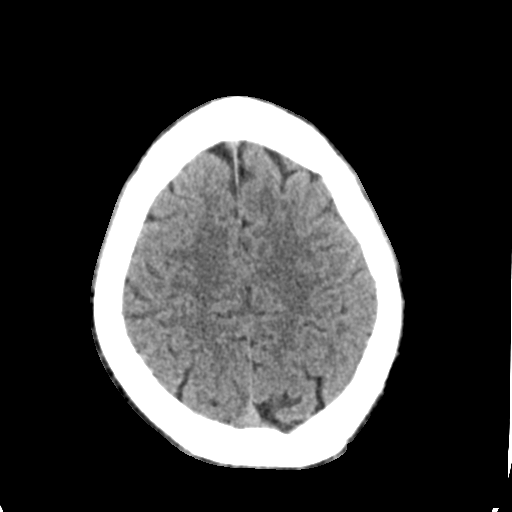
[im 23/29  bone]
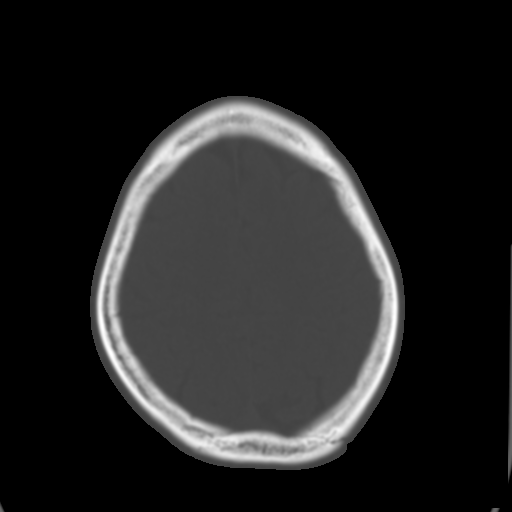
[im 24/29  brain]
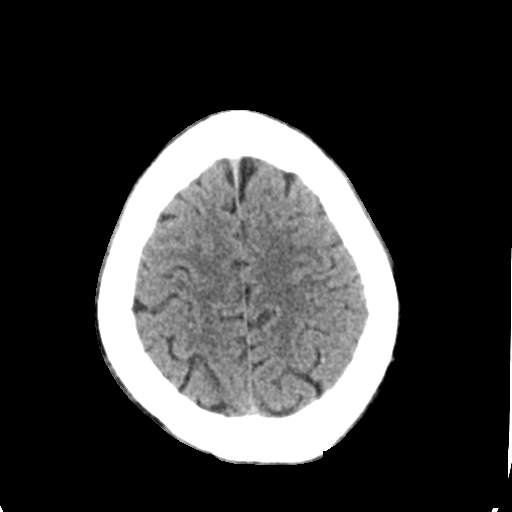
[im 26/29  brain]
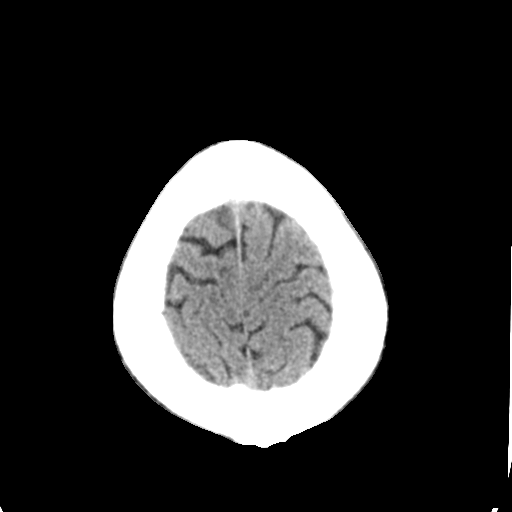
[im 28/29  brain]
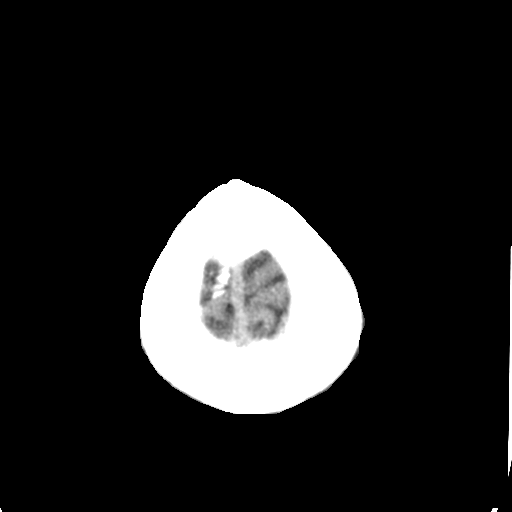

[16 of 29 positions shown; findings below may reference images not displayed]

FINDINGS: Normal appearance of the intracranial structures. No evidence for
acute hemorrhage, mass lesion, midline shift, hydrocephalus or large
infarct. No acute bony abnormality. The visualized sinuses are
clear.

Small round hypodensity along the right choroidal fissure measures 8
mm compatible with a choroidal fissure cyst versus a prominent
perivascular space.
IMPRESSION: No acute intracranial abnormality.

## 2016-11-22 IMAGING — CT CT ANGIO CHEST
1 series · 1 of 1 positions shown · IV contrast (omnipaque)
Comparison: 08/07/2014 chest x-ray

CLINICAL DATA: Acute dizziness and chest pain

EXAM:
CT ANGIOGRAPHY CHEST WITH CONTRAST
TECHNIQUE: Multidetector CT imaging of the chest was performed using the
standard protocol during bolus administration of intravenous
contrast. Multiplanar CT image reconstructions and MIPs were
obtained to evaluate the vascular anatomy.
CONTRAST:  100mL OMNIPAQUE IOHEXOL 350 MG/ML SOLN

[Series 1: topogram 0.6 t20f · coronal · 1.00mm/px · 1 of 1 slices shown]
[im 1/1]
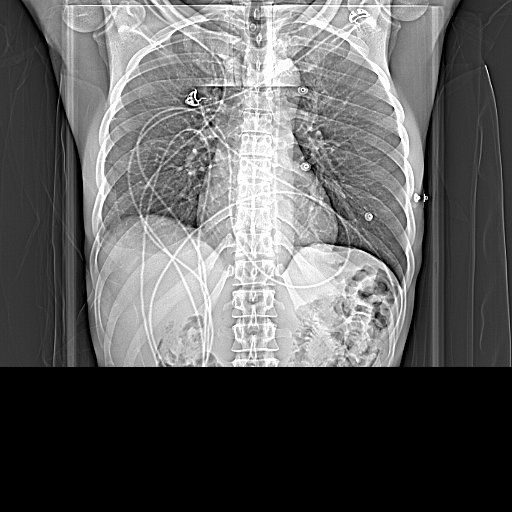

[1 of 1 positions shown; findings below may reference images not displayed]

FINDINGS: Intact thoracic aorta and major branch vessels. No significant
atherosclerosis, dissection or aneurysm. No mediastinal hemorrhage
or hematoma. Pulmonary arteries are patent. No significant pulmonary
embolus. Normal heart size. No pericardial or pleural effusion.
Negative for adenopathy.

Included upper abdomen demonstrates intact abdominal aorta. No acute
upper abdominal finding by arterial phase imaging.

Lung windows demonstrate small apical subpleural blebs and
parenchymal scarring. Lungs remain clear with minor right base
scarring or atelectasis. Negative for pneumonia, collapse or
consolidation. No interstitial process or edema. Trachea and central
airways are patent.

Review of the MIP images confirms the above findings.
IMPRESSION: Negative for acute aortic dissection.

Negative for significant acute pulmonary embolus.

No acute intra thoracic finding.

Right base atelectasis versus scarring.

## 2023-06-18 DIAGNOSIS — F200 Paranoid schizophrenia: Secondary | ICD-10-CM | POA: Diagnosis not present

## 2023-08-14 ENCOUNTER — Other Ambulatory Visit: Payer: Self-pay | Admitting: Family Medicine

## 2023-08-14 DIAGNOSIS — R1032 Left lower quadrant pain: Secondary | ICD-10-CM

## 2023-08-14 DIAGNOSIS — R519 Headache, unspecified: Secondary | ICD-10-CM

## 2023-08-14 DIAGNOSIS — M545 Low back pain, unspecified: Secondary | ICD-10-CM

## 2023-08-14 DIAGNOSIS — M79605 Pain in left leg: Secondary | ICD-10-CM

## 2023-09-18 ENCOUNTER — Encounter: Payer: Self-pay | Admitting: Family Medicine

## 2023-09-26 ENCOUNTER — Inpatient Hospital Stay: Admission: RE | Admit: 2023-09-26 | Payer: 59 | Source: Ambulatory Visit

## 2023-09-26 ENCOUNTER — Other Ambulatory Visit: Payer: 59

## 2023-10-05 ENCOUNTER — Other Ambulatory Visit: Payer: 59

## 2023-10-06 ENCOUNTER — Other Ambulatory Visit: Payer: 59

## 2023-10-10 ENCOUNTER — Inpatient Hospital Stay: Admission: RE | Admit: 2023-10-10 | Payer: 59 | Source: Ambulatory Visit

## 2023-11-05 ENCOUNTER — Ambulatory Visit (HOSPITAL_COMMUNITY)
Admission: EM | Admit: 2023-11-05 | Discharge: 2023-11-06 | Disposition: A | Discharge status: Other Acute Inpatient Hospital | Payer: MEDICAID | Attending: Psychiatry | Admitting: Psychiatry

## 2023-11-05 ENCOUNTER — Emergency Department (HOSPITAL_COMMUNITY)
Admission: EM | Admit: 2023-11-05 | Discharge: 2023-11-05 | Disposition: A | Discharge status: Home / Self Care | Payer: MEDICAID | Attending: Emergency Medicine | Admitting: Emergency Medicine

## 2023-11-05 ENCOUNTER — Encounter (HOSPITAL_COMMUNITY): Payer: Self-pay | Admitting: Emergency Medicine

## 2023-11-05 ENCOUNTER — Other Ambulatory Visit: Payer: Self-pay

## 2023-11-05 DIAGNOSIS — Z139 Encounter for screening, unspecified: Secondary | ICD-10-CM | POA: Diagnosis not present

## 2023-11-05 DIAGNOSIS — F419 Anxiety disorder, unspecified: Secondary | ICD-10-CM | POA: Diagnosis present

## 2023-11-05 DIAGNOSIS — Z5900 Homelessness unspecified: Secondary | ICD-10-CM | POA: Insufficient documentation

## 2023-11-05 DIAGNOSIS — R456 Violent behavior: Secondary | ICD-10-CM | POA: Insufficient documentation

## 2023-11-05 DIAGNOSIS — T43506A Underdosing of unspecified antipsychotics and neuroleptics, initial encounter: Secondary | ICD-10-CM | POA: Insufficient documentation

## 2023-11-05 DIAGNOSIS — F200 Paranoid schizophrenia: Secondary | ICD-10-CM | POA: Diagnosis present

## 2023-11-05 DIAGNOSIS — F201 Disorganized schizophrenia: Secondary | ICD-10-CM | POA: Insufficient documentation

## 2023-11-05 DIAGNOSIS — Z91128 Patient's intentional underdosing of medication regimen for other reason: Secondary | ICD-10-CM | POA: Insufficient documentation

## 2023-11-05 LAB — MAGNESIUM: Magnesium: 2.3 mg/dL (ref 1.7–2.4)

## 2023-11-05 LAB — LIPID PANEL
Cholesterol: 189 mg/dL (ref 0–200)
HDL: 68 mg/dL (ref >40)
LDL Cholesterol: 105 mg/dL — ABNORMAL HIGH (ref 0–99)
Total CHOL/HDL Ratio: 2.8 ratio
Triglycerides: 80 mg/dL (ref <150)
VLDL: 16 mg/dL (ref 0–40)

## 2023-11-05 LAB — POCT URINE DRUG SCREEN - MANUAL ENTRY (I-SCREEN)
POC Amphetamine UR: NOT DETECTED
POC Buprenorphine (BUP): NOT DETECTED
POC Cocaine UR: NOT DETECTED
POC Marijuana UR: POSITIVE — AB
POC Methadone UR: NOT DETECTED
POC Methamphetamine UR: NOT DETECTED
POC Morphine: NOT DETECTED
POC Oxazepam (BZO): NOT DETECTED
POC Oxycodone UR: NOT DETECTED
POC Secobarbital (BAR): NOT DETECTED

## 2023-11-05 LAB — COMPREHENSIVE METABOLIC PANEL
ALT: 18 U/L (ref 0–44)
AST: 24 U/L (ref 15–41)
Albumin: 4.5 g/dL (ref 3.5–5.0)
Alkaline Phosphatase: 69 U/L (ref 38–126)
Anion gap: 11 (ref 5–15)
BUN: 5 mg/dL — ABNORMAL LOW (ref 6–20)
CO2: 27 mmol/L (ref 22–32)
Calcium: 10.3 mg/dL (ref 8.9–10.3)
Chloride: 100 mmol/L (ref 98–111)
Creatinine, Ser: 0.97 mg/dL (ref 0.61–1.24)
GFR, Estimated: >60 mL/min (ref >60)
Glucose, Bld: 92 mg/dL (ref 70–99)
Potassium: 4.5 mmol/L (ref 3.5–5.1)
Sodium: 138 mmol/L (ref 135–145)
Total Bilirubin: 0.8 mg/dL (ref 0.0–1.2)
Total Protein: 7.8 g/dL (ref 6.5–8.1)

## 2023-11-05 LAB — CBC WITH DIFFERENTIAL/PLATELET
Abs Immature Granulocytes: 0.03 10*3/uL (ref 0.00–0.07)
Basophils Absolute: 0 10*3/uL (ref 0.0–0.1)
Basophils Relative: 0 %
Eosinophils Absolute: 0 10*3/uL (ref 0.0–0.5)
Eosinophils Relative: 0 %
HCT: 47.4 % (ref 39.0–52.0)
Hemoglobin: 15.6 g/dL (ref 13.0–17.0)
Immature Granulocytes: 0 %
Lymphocytes Relative: 26 %
Lymphs Abs: 1.9 10*3/uL (ref 0.7–4.0)
MCH: 32 pg (ref 26.0–34.0)
MCHC: 32.9 g/dL (ref 30.0–36.0)
MCV: 97.3 fL (ref 80.0–100.0)
Monocytes Absolute: 0.5 10*3/uL (ref 0.1–1.0)
Monocytes Relative: 6 %
Neutro Abs: 5.1 10*3/uL (ref 1.7–7.7)
Neutrophils Relative %: 68 %
Platelets: 235 10*3/uL (ref 150–400)
RBC: 4.87 MIL/uL (ref 4.22–5.81)
RDW: 11.7 % (ref 11.5–15.5)
WBC: 7.6 10*3/uL (ref 4.0–10.5)
nRBC: 0 % (ref 0.0–0.2)

## 2023-11-05 LAB — URINALYSIS, MICROSCOPIC (REFLEX)
Squamous Epithelial / HPF: NONE SEEN /HPF (ref 0–5)
WBC, UA: NONE SEEN WBC/hpf (ref 0–5)

## 2023-11-05 LAB — URINALYSIS, ROUTINE W REFLEX MICROSCOPIC
Bilirubin Urine: NEGATIVE
Glucose, UA: NEGATIVE mg/dL
Hgb urine dipstick: NEGATIVE
Ketones, ur: 15 mg/dL — AB
Leukocytes,Ua: NEGATIVE
Nitrite: NEGATIVE
Protein, ur: 100 mg/dL — AB
Specific Gravity, Urine: >1.03 — ABNORMAL HIGH (ref 1.005–1.030)
pH: 6 (ref 5.0–8.0)

## 2023-11-05 LAB — HEMOGLOBIN A1C
Hgb A1c MFr Bld: 4.4 % — ABNORMAL LOW (ref 4.8–5.6)
Mean Plasma Glucose: 79.58 mg/dL

## 2023-11-05 LAB — RPR: RPR Ser Ql: NONREACTIVE

## 2023-11-05 LAB — TSH: TSH: 1.685 u[IU]/mL (ref 0.350–4.500)

## 2023-11-05 LAB — ETHANOL: Alcohol, Ethyl (B): <10 mg/dL (ref <10)

## 2023-11-05 MED ORDER — LORAZEPAM 2 MG/ML IJ SOLN
2.0000 mg | Freq: Three times a day (TID) | INTRAMUSCULAR | Status: DC | PRN
  Administered 2023-11-06: 2 mg via INTRAMUSCULAR

## 2023-11-05 MED ORDER — OLANZAPINE 5 MG PO TABS
5.0000 mg | ORAL_TABLET | Freq: Two times a day (BID) | ORAL | Status: DC
  Filled 2023-11-05: qty 1

## 2023-11-05 MED ORDER — MAGNESIUM HYDROXIDE 400 MG/5ML PO SUSP: 30.0000 mL | Freq: Every day | ORAL | Status: DC | PRN

## 2023-11-05 MED ORDER — DIPHENHYDRAMINE HCL 50 MG PO CAPS: 50.0000 mg | ORAL_CAPSULE | Freq: Three times a day (TID) | ORAL | Status: DC | PRN

## 2023-11-05 MED ORDER — HALOPERIDOL 5 MG PO TABS: 5.0000 mg | ORAL_TABLET | Freq: Three times a day (TID) | ORAL | Status: DC | PRN

## 2023-11-05 MED ORDER — ACETAMINOPHEN 325 MG PO TABS: 650.0000 mg | ORAL_TABLET | Freq: Four times a day (QID) | ORAL | Status: DC | PRN

## 2023-11-05 MED ORDER — LORAZEPAM 2 MG/ML IJ SOLN
2.0000 mg | Freq: Three times a day (TID) | INTRAMUSCULAR | Status: DC | PRN
  Filled 2023-11-05 (×2): qty 1

## 2023-11-05 MED ORDER — ALUM & MAG HYDROXIDE-SIMETH 200-200-20 MG/5ML PO SUSP: 30.0000 mL | ORAL | Status: DC | PRN

## 2023-11-05 MED ORDER — DIPHENHYDRAMINE HCL 50 MG/ML IJ SOLN
50.0000 mg | Freq: Three times a day (TID) | INTRAMUSCULAR | Status: DC | PRN
  Filled 2023-11-05 (×2): qty 1

## 2023-11-05 MED ORDER — HALOPERIDOL LACTATE 5 MG/ML IJ SOLN
10.0000 mg | Freq: Three times a day (TID) | INTRAMUSCULAR | Status: DC | PRN
  Administered 2023-11-06: 10 mg via INTRAMUSCULAR
  Filled 2023-11-05 (×2): qty 2

## 2023-11-05 MED ORDER — DIPHENHYDRAMINE HCL 50 MG/ML IJ SOLN
50.0000 mg | Freq: Three times a day (TID) | INTRAMUSCULAR | Status: DC | PRN
  Administered 2023-11-06: 50 mg via INTRAMUSCULAR

## 2023-11-05 MED ORDER — HALOPERIDOL LACTATE 5 MG/ML IJ SOLN: 5.0000 mg | Freq: Three times a day (TID) | INTRAMUSCULAR | Status: DC | PRN
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
## 2023-11-05 NOTE — ED Notes (Signed)
 Patient resting quietly in bed with eyes closed. Respirations equal and unlabored, skin warm and dry, NAD. Routine safety checks conducted according to facility protocol. Will continue to monitor for safety.

## 2023-11-05 NOTE — ED Triage Notes (Addendum)
 k

## 2023-11-05 NOTE — ED Notes (Signed)
 Patient left stating that he "had things to do". Sharilyn Sites PA made aware.

## 2023-11-05 NOTE — ED Provider Notes (Signed)
 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 He presents with increased bizarre, disorganized behaviors, along with paranopid thinking. When asked the reason for his visit, patient states "a few problems"  but unable to elaborate.  Per chart review, patient was at [facility] ED and was brought here be police after he left the ED AMA. Patient is disoriented to reality but denying having any problem. He reports no hx of mental illness. However, per chart review, patient has a hx of Paranoid Schizophrenia with medication non-adherence. It is documented that in 2016, patient was admitted involuntarily at the ED for a psychotic episode.   Face-to-face evaluation initiated by this clinician: 48 year-old male sitting in the assessment room. He is talking to himself, preoccupied. He is partially oriented and states " I am good".  Patient is guarded and unable to fully participate in this assessment due to his disturbed thought process.  He denies chest pain. Denies respiratory distress. Denies back/abdominal pain.   Inpatient recommended  for stabilization.   Per nursing: "Patient admitted to obs unit. Patient presented disorganized and tangential. While attempting blood draw by MHT, patient became frustrated and grabbed the needle from his arm fully exposing the needle and splattering blood around. Patient swung the needle around and struck MHT in finger. Needle immediately retrieved from patient and patient redirected. MHT immediately removed from scene and followed exposure protocol. CNO contacted and made aware of exposure. No other injuries noted to staff or patient. Patient refused EKG. Patient cooperative with skin check and admitted to obs unit in our flex area.     12:00pm- Due to exposure a new blood draw was necessary, patient clearly irritable, disorganized, and initially refusing. Patient eventually verbally de-escalated by staff and allowed to collect blood with assistance from security. No restraint required. Patient then provided with meal and drink. Patient currently in flex area at this time eating calmly with no s/s of current distress.   Total Time spent with patient: 30 minutes  Musculoskeletal  Strength & Muscle Tone: within normal limits Gait & Station: normal Patient leans: N/A  Psychiatric Specialty Exam  Presentation General Appearance:  Casual  Eye Contact: Fair  Speech: Garbled  Speech Volume: Increased  Handedness: Right   Mood and Affect  Mood: Anxious; Labile  Affect: Non-Congruent; Labile   Thought Process  Thought Processes: Disorganized; Irrevelant  Descriptions of Associations:Tangential  Orientation:Partial  Thought Content:Delusions; Illusions; Obsessions  Diagnosis of Schizophrenia or Schizoaffective disorder in past: Yes  Duration of Psychotic Symptoms: Greater than six months  Hallucinations:Hallucinations: Other (comment); None (UTA)  Ideas of Reference:Delusions; Percusatory; Paranoia (UTA)  Suicidal Thoughts:Suicidal Thoughts: No  Homicidal Thoughts:Homicidal Thoughts: No   Sensorium  Memory: Immediate Poor; Recent Poor; Remote Poor  Judgment: Impaired  Insight: Poor   Executive Functions  Concentration: Poor  Attention Span: Poor  Recall: Poor  Fund of Knowledge: Fair  Language: Fair   Psychomotor Activity  Psychomotor Activity: Psychomotor Activity: Restlessness   Assets  Assets: Physical Health; Desire for Improvement; Communication Skills   Sleep  Sleep: Sleep: Poor ("I don't need to sleep")   Nutritional Assessment (For OBS and FBC admissions only) Has the patient had a weight loss or gain of 10 pounds or more in the last 3 months?:  No  Has the patient had a decrease in food intake/or appetite?: No Does the patient have dental problems?: No Does the patient have eating habits or behaviors that may be indicators of an eating disorder including binging or inducing vomiting?: No Has the patient recently lost weight without trying?: 0 Has the patient been eating poorly because of a decreased appetite?: 0 Malnutrition Screening Tool Score: 0    Physical Exam Constitutional:      Appearance: Normal appearance.  HENT:     Head: Normocephalic and atraumatic.     Right Ear: Tympanic membrane normal.     Left Ear: Tympanic membrane normal.     Nose: Nose normal.     Mouth/Throat:     Mouth: Mucous membranes are moist.  Eyes:     Extraocular Movements: Extraocular movements intact.     Pupils: Pupils are equal, round, and reactive to light.  Cardiovascular:     Rate and Rhythm: Normal rate.     Pulses: Normal pulses.  Pulmonary:     Effort: Pulmonary effort is normal.  Musculoskeletal:        General: Normal range of motion.     Cervical back: Normal range of motion and neck supple.  Neurological:     General: No focal deficit present.     Mental Status: He is alert and oriented to person, place, and time.    Review of Systems  Constitutional: Negative.   HENT: Negative.    Eyes: Negative.   Respiratory: Negative.    Cardiovascular: Negative.   Gastrointestinal: Negative.   Genitourinary: Negative.   Musculoskeletal: Negative.   Skin: Negative.   Neurological: Negative.   Endo/Heme/Allergies: Negative.   Psychiatric/Behavioral:  Positive for hallucinations. The patient is nervous/anxious.     Blood pressure (!) 130/101, pulse 88, temperature 98.4 F (36.9 C), temperature source Oral, SpO2 99%. There is no height or weight on file to calculate BMI.  Past Psychiatric History: Paranoid Schizophrenia   Is the patient at risk to self? No  Has the patient been a risk to self in the past 6 months? No .     Has the patient been a risk to self within the distant past? No   Is the patient a risk to others? No   Has the patient been a risk to others in the past 6 months? No   Has the patient been a risk to others within the distant past? No   Past Medical History: NA  Family History: NA  Social History: NA  Last Labs:  No visits with results within 6 Month(s) from this visit.  Latest known visit with results is:  Admission on 08/24/2014, Discharged on 08/25/2014  Component Date Value Ref Range Status   WBC 08/24/2014 5.2  4.0 - 10.5 K/uL Final   RBC 08/24/2014 4.41  4.22 - 5.81 MIL/uL Final   Hemoglobin 08/24/2014 14.1  13.0 - 17.0 g/dL Final   HCT 09/81/1914 41.8  39.0 - 52.0 % Final   MCV 08/24/2014 94.8  78.0 - 100.0 fL Final   MCH 08/24/2014 32.0  26.0 - 34.0 pg Final   MCHC 08/24/2014 33.7  30.0 - 36.0 g/dL Final   RDW 78/29/5621 12.5  11.5 - 15.5 % Final   Platelets 08/24/2014 179  150 - 400 K/uL Final   Neutrophils Relative % 08/24/2014 59  43 - 77 % Final   Neutro Abs 08/24/2014 3.0  1.7 - 7.7 K/uL Final   Lymphocytes Relative 08/24/2014 36  12 -  46 % Final   Lymphs Abs 08/24/2014 1.9  0.7 - 4.0 K/uL Final   Monocytes Relative 08/24/2014 5  3 - 12 % Final   Monocytes Absolute 08/24/2014 0.3  0.1 - 1.0 K/uL Final   Eosinophils Relative 08/24/2014 0  0 - 5 % Final   Eosinophils Absolute 08/24/2014 0.0  0.0 - 0.7 K/uL Final   Basophils Relative 08/24/2014 0  0 - 1 % Final   Basophils Absolute 08/24/2014 0.0  0.0 - 0.1 K/uL Final   Sodium 08/24/2014 140  135 - 145 mmol/L Final   Potassium 08/24/2014 3.8  3.5 - 5.1 mmol/L Final   Chloride 08/24/2014 103  96 - 112 mEq/L Final   BUN 08/24/2014 14  6 - 23 mg/dL Final   Creatinine, Ser 08/24/2014 0.80  0.50 - 1.35 mg/dL Final   Glucose, Bld 16/05/9603 109 (H)  70 - 99 mg/dL Final   Calcium, Ion 54/04/8118 1.21  1.12 - 1.23 mmol/L Final   TCO2 08/24/2014 22  0 - 100 mmol/L Final   Hemoglobin 08/24/2014 16.3  13.0 - 17.0 g/dL  Final   HCT 14/78/2956 48.0  39.0 - 52.0 % Final   Opiates 08/24/2014 NONE DETECTED  NONE DETECTED Final   Cocaine 08/24/2014 NONE DETECTED  NONE DETECTED Final   Benzodiazepines 08/24/2014 NONE DETECTED  NONE DETECTED Final   Amphetamines 08/24/2014 NONE DETECTED  NONE DETECTED Final   Tetrahydrocannabinol 08/24/2014 NONE DETECTED  NONE DETECTED Final   Barbiturates 08/24/2014 NONE DETECTED  NONE DETECTED Final   Comment:        DRUG SCREEN FOR MEDICAL PURPOSES ONLY.  IF CONFIRMATION IS NEEDED FOR ANY PURPOSE, NOTIFY LAB WITHIN 5 DAYS.        LOWEST DETECTABLE LIMITS FOR URINE DRUG SCREEN Drug Class       Cutoff (ng/mL) Amphetamine      1000 Barbiturate      200 Benzodiazepine   200 Tricyclics       300 Opiates          300 Cocaine          300 THC              50    Alcohol, Ethyl (B) 08/24/2014 <5  0 - 9 mg/dL Final   Comment:        LOWEST DETECTABLE LIMIT FOR SERUM ALCOHOL IS 11 mg/dL FOR MEDICAL PURPOSES ONLY     Allergies: Patient has no known allergies.  Medications:     Medical Decision Making  Admit to Observation unit. Recommend Inpatient  Medications: Agitation protocol and, Acetaminophen 650 mg PO Q 6hrs PRN Maalox 30 ml PO QID PRN Milk of magnesia 30 ml PO Daily PRN  Labs: CBC, CMP, RPR, TSH, A1C, UDS, UA, Ethanol, Magnesium, Lipid Panel, Hepatic function panel  EKG    Recommendations  Based on my evaluation the patient does not appear to have an emergency medical condition.  Olin Pia, NP 11/05/23  10:18 AM

## 2023-11-05 NOTE — ED Notes (Signed)
 Patient admitted to obs unit. Patient presented disorganized and tangential. While attempting blood draw by MHT, patient became frustrated and grabbed the needle from his arm fully exposing the needle and splattering blood around. Patient swung the needle around and struck MHT in finger. Needle immediately retrieved from patient and patient redirected. MHT immediately removed from scene and followed exposure protocol. CNO contacted and made aware of exposure. No other injuries noted to staff or patient. Patient refused EKG. Patient cooperative with skin check and admitted to obs unit in our flex area.   12:00pm- Due to exposure a new blood draw was necessary, patient clearly irritable, disorganized, and initially refusing. Patient eventually verbally de-escalated by staff and allowed to collect blood with assistance from security. No restraint required. Patient then provided with meal and drink. Patient currently in flex area at this time eating calmly with no s/s of current distress.

## 2023-11-05 NOTE — ED Provider Notes (Signed)
 [place] EMERGENCY DEPARTMENT AT [facility] Provider Note   CSN: [id number] Arrival date & time: 11/05/23  0258     History  Chief Complaint  Patient presents with   Anxiety    Matthew Carney is a 48 y.o. male.  The history is provided by the patient and medical records.  Anxiety   48 year old male with history of schizophrenia, presenting to the ED for unclear reasons.  Patient denies any physical complaints, states he feels fine.  When questioned if he needed anything he states no.  He denies SI/HI/AVH.  He denies feeling like anyone is trying to hurt him.  He does admit to some drug and alcohol use yesterday.  Home Medications Prior to Admission medications   Medication Sig Start Date End Date Taking? Authorizing Provider  methocarbamol (ROBAXIN) 500 MG tablet Take 1 tablet (500 mg total) by mouth 2 (two) times daily. Patient not taking: Reported on 08/24/2014 08/07/14   Muthersbaugh, Dahlia Client, PA-C  omeprazole (PRILOSEC) 20 MG capsule Take 1 capsule (20 mg total) by mouth daily. Patient not taking: Reported on 08/24/2014 08/07/14   Muthersbaugh, Dahlia Client, PA-C      Allergies    Patient has no known allergies.    Review of Systems   Review of Systems  All other systems reviewed and are negative.   Physical Exam Updated Vital Signs BP (!) 161/106 (BP Location: Right Arm)   Pulse 87   Temp 98 F (36.7 C)   Resp 20   Ht 5\' 4"  (1.626 m) Comment: Simultaneous filing. User may not have seen previous data.  Wt 80.3 kg Comment: Simultaneous filing. User may not have seen previous data.  SpO2 100%   BMI 30.38 kg/m   Physical Exam Vitals and nursing note reviewed.  Constitutional:      Appearance: He is well-developed.  HENT:     Head: Normocephalic and atraumatic.  Eyes:     Conjunctiva/sclera: Conjunctivae normal.     Pupils: Pupils are equal, round, and reactive to light.  Cardiovascular:     Rate and Rhythm: Normal rate and regular rhythm.      Heart sounds: Normal heart sounds.  Pulmonary:     Effort: Pulmonary effort is normal.     Breath sounds: Normal breath sounds.  Abdominal:     General: Bowel sounds are normal.     Palpations: Abdomen is soft.  Musculoskeletal:        General: Normal range of motion.     Cervical back: Normal range of motion.  Skin:    General: Skin is warm and dry.  Neurological:     Mental Status: He is alert and oriented to person, place, and time.  Psychiatric:     Comments: Denies SI/HI/AVH     ED Results / Procedures / Treatments   Labs (all labs ordered are listed, but only abnormal results are displayed) Labs Reviewed - No data to display  EKG None  Radiology No results found.  Procedures Procedures    Medications Ordered in ED Medications - No data to display  ED Course/ Medical Decision Making/ A&P                                 Medical Decision Making  48 year old male presenting to the ED for unclear reasons.  He denies any physical complaints.  Denies any SI/HI/AVH.  When asked what prompted him to come in  he is not really able to give a clear explanation.  Shortly after exam he gathered his belongings and left.  He did not express any suicidal homicidal ideation, does not appear to be a danger to himself or others.  Do not feel he warrants IVC.  Final Clinical Impression(s) / ED Diagnoses Final diagnoses:  Encounter for medical screening examination    Rx / DC Orders ED Discharge Orders     None         Garlon Hatchet, PA-C 11/05/23 0423    Zadie Rhine, MD 11/05/23 [phone]

## 2023-11-05 NOTE — ED Triage Notes (Signed)
 The pt reports that he used drugs and alcohol earlier today  but now he has stopped

## 2023-11-05 NOTE — BH Assessment (Signed)
 Comprehensive Clinical Assessment (CCA) Note  11/05/2023 AKEEN LEDYARD [id number]  Disposition: Olin Pia, NP recommends continuous observation.   The patient demonstrates the following risk factors for suicide: Chronic risk factors for suicide include: psychiatric disorder of Schizophrenia Disorder . Acute risk factors for suicide include: family or marital conflict and social withdrawal/isolation. Protective factors for this patient include: hope for the future. Considering these factors, the overall suicide risk at this point appears to be low. Patient is appropriate for outpatient follow up.    Pt is a 48 yo male who presents to [facility] voluntarily. Pt was brought in by special police from [facility] ED. According to pt's chart, pt was at [facility] ED for unclear reason but then left AMA. Per pt, he was given an injection at [facility] ED that felt like fire going through his veins. Pt reports that he came to [facility] because he is hearing voices. Per pt's chart, pt has hx of Paranoid Schizophrenia. Pt reports that he is not active with  outpatient services. Pt reports that he is not sleeping well due to life stressors. Pt is currently homeless.  Pt reports that he was staying with family but now he is on his own. Pt reports that he got out of prison Sept 3, 2024. Pt reports that he is not sleeping well. Pt denies outpatient services.   On evaluation, patient is alert, oriented x 3, and cooperative. Speech is clear, coherent and logical. Pt appears casual. Eye contact is fair. Mood is anxious and depressed, affect is congruent with mood. Thought process is logical and thought content is coherent. Pt denies SI/HI/AVH. There is no indication that the patient is responding to internal stimuli. No delusions elicited during this assessment.     Chief Complaint: "Hearing all kinds of things"  Visit Diagnosis:  Schizophrenia Disorder     CCA Screening, Triage and Referral (STR)  Patient  Reported Information How did you hear about [place]? Other (Comment) ([place] special police)  What Is the Reason for Your Visit/Call Today? Pt is a 48 yo male who presents to [facility] voluntarily. Pt was brought in by special police from [facility] ED. According to pt's chart, pt was at [facility] ED for unclear reason but then left AMA. Per pt, he was given an injection at [facility] ED that felt like fire going through his veins. Pt reports that he came to [facility] because he is hearing voices. Per pt's chart, pt has hx of Paranoid Schizophrenia. Pt reports that he is not active with  outpatient services. Pt reports that he is not sleeping well due to life stressors. Pt is currently homeless. How Long Has This Been Causing You Problems? 1-6 months  What Do You Feel Would Help You the Most Today? Treatment for Depression or other mood problem; Stress Management; Medication(s)   Have You Recently Had Any Thoughts About Hurting Yourself? No  Are You Planning to Commit Suicide/Harm Yourself At This time? No   Flowsheet Row ED from 11/05/2023 in [facility] Most recent reading at 11/05/2023 10:15 AM ED from 11/05/2023 in [facility] Emergency Department at [facility] Most recent reading at 11/05/2023  3:11 AM  C-SSRS RISK CATEGORY No Risk No Risk       Have you Recently Had Thoughts About Hurting Someone Karolee Ohs? No  Are You Planning to Harm Someone at This Time? No  Explanation: Pt denies HI   Have You Used Any  Alcohol or Drugs in the Past 24 Hours? No  How Long Ago Did You Use Drugs or Alcohol? No data recorded What Did You Use and How Much? No data recorded  Do You Currently Have a Therapist/Psychiatrist? No  Name of Therapist/Psychiatrist:    Have You Been Recently Discharged From Any Office Practice or Programs? No  Explanation of Discharge From Practice/Program: No data recorded    CCA Screening Triage Referral Assessment Type of Contact:  Face-to-Face  Telemedicine Service Delivery:   Is this Initial or Reassessment?   Date Telepsych consult ordered in [facility]:    Time Telepsych consult ordered in [facility]:    Location of Assessment: [facility] Assessment Services  Provider Location: [facility] Assessment Services   Collateral Involvement: None   Does Patient Have a Automotive engineer Guardian? No  Legal Guardian Contact Information: n/a  Copy of Legal Guardianship Form: -- (n/a)  Legal Guardian Notified of Arrival: -- (n/a)  Legal Guardian Notified of Pending Discharge: -- (n/a)  If Minor and Not Living with Parent(s), Who has Custody? n/a  Is CPS involved or ever been involved? Never  Is APS involved or ever been involved? Never   Patient Determined To Be At Risk for Harm To Self or Others Based on Review of Patient Reported Information or Presenting Complaint? No  Method: No Plan  Availability of Means: No access or NA  Intent: Vague intent or NA  Notification Required: No need or identified person  Additional Information for Danger to Others Potential: -- (n/a)  Additional Comments for Danger to Others Potential: n/a  Are There Guns or Other Weapons in Your Home? No  Types of Guns/Weapons: Denies access  Are These Weapons Safely Secured?                            No  Who Could Verify You Are Able To Have These Secured: Denies access  Do You Have any Outstanding Charges, Pending Court Dates, Parole/Probation? Pt denies pending legal charges  Contacted To Inform of Risk of Harm To Self or Others: -- (n/a)    Does Patient Present under Involuntary Commitment? No    [place] of Residence: [place]   Patient Currently Receiving the Following Services: Not Receiving Services   Determination of Need: Routine (7 days)   Options For Referral: Medication Management; Outpatient Therapy     CCA Biopsychosocial Patient Reported Schizophrenia/Schizoaffective Diagnosis in Past: Yes   Strengths:  Pt is willing to seek treatment   Mental Health Symptoms Depression:  None   Duration of Depressive symptoms:    Mania:  None   Anxiety:   Restlessness   Psychosis:  Hallucinations   Duration of Psychotic symptoms: Duration of Psychotic Symptoms: Greater than six months   Trauma:  None   Obsessions:  None   Compulsions:  None   Inattention:  None   Hyperactivity/Impulsivity:  Symptoms present before age 35   Oppositional/Defiant Behaviors:  None   Emotional Irregularity:  None   Other Mood/Personality Symptoms:  none    Mental Status Exam Appearance and self-care  Stature:  Tall   Weight:  Average weight   Clothing:  Casual   Grooming:  Normal   Cosmetic use:  None   Posture/gait:  Normal   Motor activity:  Not Remarkable   Sensorium  Attention:  Normal   Concentration:  Normal   Orientation:  X5   Recall/memory:  Normal   Affect  and Mood  Affect:  Anxious   Mood:  Anxious   Relating  Eye contact:  Normal   Facial expression:  Anxious   Attitude toward examiner:  Cooperative   Thought and Language  Speech flow: Clear and Coherent   Thought content:  Suspicious (Pt reports receiving a injection that made his veins feel like fire.)   Preoccupation:  None   Hallucinations:  Auditory   Organization:  Circumstantial   Company secretary of Knowledge:  Fair   Intelligence:  Average   Abstraction:  Normal   Judgement:  Fair   Dance movement psychotherapist:  Adequate   Insight:  Fair   Decision Making:  Impulsive   Social Functioning  Social Maturity:  Impulsive   Social Judgement:  "Chief of Staff"; Victimized   Stress  Stressors:  Family conflict; Transitions   Coping Ability:  Exhausted; Overwhelmed   Skill Deficits:  Decision making; Communication; Interpersonal   Supports:  Support needed (Pt reports that he does not have any support anymore. He is on his own.)     Religion: Religion/Spirituality Are You A Religious  Person?: No How Might This Affect Treatment?: n/a  Leisure/Recreation: Leisure / Recreation Do You Have Hobbies?: No  Exercise/Diet: Exercise/Diet Do You Exercise?: No Have You Gained or Lost A Significant Amount of Weight in the Past Six Months?: No Do You Follow a Special Diet?: No Do You Have Any Trouble Sleeping?: No   CCA Employment/Education Employment/Work Situation: Employment / Work Situation Employment Situation: Unemployed Patient's Job has Been Impacted by Current Illness: No Has Patient ever Been in Equities trader?: No  Education: Education Is Patient Currently Attending School?: No Last Grade Completed: 12 Did You Product manager?: No Did You Have An Individualized Education Program (IIEP): No Did You Have Any Difficulty At [employer]?: No Patient's Education Has Been Impacted by Current Illness: No   CCA Family/Childhood History Family and Relationship History: Family history Marital status: Single Does patient have children?: No  Childhood History:  Childhood History By whom was/is the patient raised?: Mother Did patient suffer any verbal/emotional/physical/sexual abuse as a child?: No Did patient suffer from severe childhood neglect?: No Has patient ever been sexually abused/assaulted/raped as an adolescent or adult?: No Was the patient ever a victim of a crime or a disaster?: No Witnessed domestic violence?: No Has patient been affected by domestic violence as an adult?: No       CCA Substance Use Alcohol/Drug Use: Alcohol / Drug Use Pain Medications: See MAR Prescriptions: See MAR Over the Counter: See MAR History of alcohol / drug use?: No history of alcohol / drug abuse Longest period of sobriety (when/how long): Pt denies substance/ etoh use Negative Consequences of Use:  (n/a) Withdrawal Symptoms:  (n/a)                         ASAM's:  Six Dimensions of Multidimensional Assessment  Dimension 1:  Acute Intoxication and/or  Withdrawal Potential:      Dimension 2:  Biomedical Conditions and Complications:      Dimension 3:  Emotional, Behavioral, or Cognitive Conditions and Complications:     Dimension 4:  Readiness to Change:     Dimension 5:  Relapse, Continued use, or Continued Problem Potential:     Dimension 6:  Recovery/Living Environment:     ASAM Severity Score:    ASAM Recommended Level of Treatment: ASAM Recommended Level of Treatment:  (n/a)   Substance use Disorder (  SUD) Substance Use Disorder (SUD)  Checklist Symptoms of Substance Use:  (n/a)  Recommendations for Services/Supports/Treatments: Recommendations for Services/Supports/Treatments Recommendations For Services/Supports/Treatments: Inpatient Hospitalization, Individual Therapy, Medication Management  Disposition Recommendation per psychiatric provider:  Continuous observation    DSM5 Diagnoses: Patient Active Problem List   Diagnosis Date Noted   Suicidal ideation    Paranoid schizophrenia (HCC)      Referrals to Alternative Service(s): Referred to Alternative Service(s):   Place:   Date:   Time:    Referred to Alternative Service(s):   Place:   Date:   Time:    Referred to Alternative Service(s):   Place:   Date:   Time:    Referred to Alternative Service(s):   Place:   Date:   Time:     Dava Najjar, [place], [facility], NCC

## 2023-11-05 NOTE — ED Triage Notes (Signed)
 The pt reports that he  is not si or hi he just wants to go on the floor for awhile  he reports that everybody is talking all the time and he just wants to rest then leave

## 2023-11-05 NOTE — ED Notes (Signed)
 Patient observed hovering over other patient while patient was resting. Patient is paranoid, suspicious, and disorganized. Patient calmer since removing other patient from flex area. Patient ate dinner and staff were able to redirect at this time.

## 2023-11-05 NOTE — ED Notes (Addendum)
 Pt becoming boisterous. Multiple staff members informed him that he cannot be yelling and threatening staff. He is stating, "earlier, when you all were in here, I could've killed all of yall." Staff is trying to get another pt cleared to go to [facility], so that this pt can remain in flex alone (there is currently another pt in flex). Staff has asked him to sit down & rest.

## 2023-11-05 NOTE — ED Notes (Signed)
 Patient observed/assessed in bed/chair resting quietly appearing in no distress and verbalizing no complaints at this time. Continuing to monitor.

## 2023-11-06 ENCOUNTER — Other Ambulatory Visit: Payer: Self-pay

## 2023-11-06 ENCOUNTER — Encounter (HOSPITAL_COMMUNITY): Payer: Self-pay | Admitting: Psychiatry

## 2023-11-06 ENCOUNTER — Inpatient Hospital Stay (HOSPITAL_COMMUNITY)
Admission: AD | Admit: 2023-11-06 | Discharge: 2023-11-13 | DRG: 885 | Disposition: A | Discharge status: Home / Self Care | Payer: MEDICAID | Source: Intra-hospital | Attending: Psychiatry | Admitting: Psychiatry

## 2023-11-06 DIAGNOSIS — Z555 Less than a high school diploma: Secondary | ICD-10-CM

## 2023-11-06 DIAGNOSIS — F17210 Nicotine dependence, cigarettes, uncomplicated: Secondary | ICD-10-CM | POA: Diagnosis present

## 2023-11-06 DIAGNOSIS — F419 Anxiety disorder, unspecified: Secondary | ICD-10-CM | POA: Diagnosis not present

## 2023-11-06 DIAGNOSIS — F200 Paranoid schizophrenia: Principal | ICD-10-CM | POA: Diagnosis present

## 2023-11-06 DIAGNOSIS — F32A Depression, unspecified: Secondary | ICD-10-CM | POA: Diagnosis present

## 2023-11-06 DIAGNOSIS — F1210 Cannabis abuse, uncomplicated: Secondary | ICD-10-CM | POA: Diagnosis present

## 2023-11-06 DIAGNOSIS — Z9151 Personal history of suicidal behavior: Secondary | ICD-10-CM

## 2023-11-06 DIAGNOSIS — Z5900 Homelessness unspecified: Secondary | ICD-10-CM

## 2023-11-06 MED ORDER — HALOPERIDOL 5 MG PO TABS: 5.0000 mg | ORAL_TABLET | Freq: Three times a day (TID) | ORAL | Status: DC | PRN

## 2023-11-06 MED ORDER — ACETAMINOPHEN 325 MG PO TABS: 650.0000 mg | ORAL_TABLET | Freq: Four times a day (QID) | ORAL | Status: DC | PRN

## 2023-11-06 MED ORDER — LORAZEPAM 2 MG/ML IJ SOLN: 2.0000 mg | Freq: Three times a day (TID) | INTRAMUSCULAR | Status: DC | PRN

## 2023-11-06 MED ORDER — HALOPERIDOL LACTATE 5 MG/ML IJ SOLN: 10.0000 mg | Freq: Three times a day (TID) | INTRAMUSCULAR | Status: DC | PRN

## 2023-11-06 MED ORDER — DIPHENHYDRAMINE HCL 25 MG PO CAPS: 50.0000 mg | ORAL_CAPSULE | Freq: Three times a day (TID) | ORAL | Status: DC | PRN

## 2023-11-06 MED ORDER — OLANZAPINE 5 MG PO TABS
5.0000 mg | ORAL_TABLET | Freq: Two times a day (BID) | ORAL | Status: DC
  Administered 2023-11-06 – 2023-11-07 (×2): 5 mg via ORAL
  Filled 2023-11-06 (×7): qty 1

## 2023-11-06 MED ORDER — HALOPERIDOL LACTATE 5 MG/ML IJ SOLN: 5.0000 mg | Freq: Three times a day (TID) | INTRAMUSCULAR | Status: DC | PRN

## 2023-11-06 MED ORDER — ALUM & MAG HYDROXIDE-SIMETH 200-200-20 MG/5ML PO SUSP: 30.0000 mL | ORAL | Status: DC | PRN

## 2023-11-06 MED ORDER — DIPHENHYDRAMINE HCL 50 MG/ML IJ SOLN: 50.0000 mg | Freq: Three times a day (TID) | INTRAMUSCULAR | Status: DC | PRN

## 2023-11-06 MED ORDER — TRAZODONE HCL 50 MG PO TABS
50.0000 mg | ORAL_TABLET | Freq: Every evening | ORAL | Status: DC | PRN
  Administered 2023-11-08 – 2023-11-12 (×5): 50 mg via ORAL
  Filled 2023-11-06 (×6): qty 1

## 2023-11-06 NOTE — Progress Notes (Signed)
 Pt has been accepted to [facility] on 11/06/2023 Bed assignment: 504-1  Pt meets inpatient criteria per: Olin Pia NP  Attending Physician will be: Dr. Phineas Inches, MD   Report can be called to: -Adult unit: [phone]  Pt can arrive after Pending EKG VOL CONSENT   Care Team Notified: [facility] ACDanika Victory Dakin RN, Olin Pia NP, Earl Gala LPN, [place] Noe Fiscus RN    [place] Ilyse Tremain LCSW-A   11/06/2023 10:08 AM

## 2023-11-06 NOTE — Tx Team (Signed)
 Initial Treatment Plan 11/06/2023 5:24 PM NUSSEN PULLIN ZOX:[id number]    PATIENT STRESSORS: Financial difficulties   Medication change or noncompliance   Occupational concerns     PATIENT STRENGTHS: Ability for insight  Average or above average intelligence  Communication skills    PATIENT IDENTIFIED PROBLEMS: "I need help for housing and employment"  Medication noncompliance  Auditory hallucination  Ineffective coping skills  Homelessness             DISCHARGE CRITERIA:  Ability to meet basic life and health needs Safe-care adequate arrangements made  PRELIMINARY DISCHARGE PLAN: Attend aftercare/continuing care group Attend 12-step recovery group Outpatient therapy Placement in alternative living arrangements  PATIENT/FAMILY INVOLVEMENT: This treatment plan has been presented to and reviewed with the patient, LINK BURGESON, and/or family member.  The patient and family have been given the opportunity to ask questions and make suggestions.  Clarene Critchley, RN 11/06/2023, 5:24 PM

## 2023-11-06 NOTE — ED Notes (Signed)
 Patient observed a male nurse walking by, which caused agitation. The patient began pacing and started doing push-ups. Staff offered the patient a beverage, but the patient angrily declined. Encouragement and support were provided during this time, and safety was maintained throughout the incident.
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
## 2023-11-06 NOTE — ED Notes (Signed)
 Pt transported to [facility] [employer] for continuum of care. Calm, cooperative for transport. Belongings given to [employer] transporter. Safety maintained.

## 2023-11-06 NOTE — ED Notes (Signed)
 Pt pacing in flex unit. Pt breathing is even and unlabored, no s/s of distress/discomfort noted or voiced. Pt denies SI, HI, pain and AVH. Will continue to monitor for safety and report any COC.

## 2023-11-06 NOTE — ED Notes (Signed)
 Patient resting quietly in bed with eyes closed. Respirations equal and unlabored, skin warm and dry, NAD. Routine safety checks conducted according to facility protocol. Will continue to monitor for safety.

## 2023-11-06 NOTE — ED Notes (Signed)
 Report called to Audrie Lia RN @ [facility]. [employer] called to transport pt. Will continue to monitor for safety and report any COC.

## 2023-11-06 NOTE — Plan of Care (Signed)
   Problem: Education: Goal: Emotional status will improve Outcome: Not Progressing Goal: Mental status will improve Outcome: Not Progressing

## 2023-11-06 NOTE — Progress Notes (Signed)
   11/06/23 2015  Psych Admission Type (Psych Patients Only)  Admission Status Involuntary  Psychosocial Assessment  Patient Complaints None  Eye Contact Darting  Facial Expression Anxious  Affect Appropriate to circumstance;Anxious  Speech Logical/coherent  Interaction Guarded  Motor Activity Slow  Appearance/Hygiene Unremarkable  Behavior Characteristics Cooperative;Appropriate to situation  Mood Anxious;Suspicious  Thought Process  Coherency WDL  Content WDL  Delusions None reported or observed  Perception WDL  Hallucination None reported or observed  Judgment Impaired  Confusion None  Danger to Self  Current suicidal ideation? Denies  Agreement Not to Harm Self Yes  Description of Agreement verbal  Danger to Others  Danger to Others None reported or observed   Progress note   D: Pt seen in his room. Pt denies SI, HI, AVH. Pt rates pain  0/10. Pt rates anxiety  0/10 and depression  0/10. Pt guarded in his interaction. Denies issues with sleep. Did not attend group tonight. No other concerns noted at this time.  A: Pt provided support and encouragement. Pt given scheduled medication as prescribed. PRNs as appropriate. Q15 min checks for safety.   R: Pt safe on the unit. Will continue to monitor.

## 2023-11-06 NOTE — Group Note (Signed)
 Date:  11/06/2023 Time:  8:47 PM  Group Topic/Focus:  Wrap-Up Group:   The focus of this group is to help patients review their daily goal of treatment and discuss progress on daily workbooks.    Participation Level:  Did Not Attend  Participation Quality:  n/a  Affect:  n/a  Cognitive:  n/a  Insight: n/a  Engagement in Group:  None  Modes of Intervention:  n/a  Additional Comments:  Pt was encouraged but refused to attend wrap up group  Vevelyn Pat 11/06/2023, 8:47 PM

## 2023-11-07 ENCOUNTER — Encounter (HOSPITAL_COMMUNITY): Payer: Self-pay

## 2023-11-07 DIAGNOSIS — F200 Paranoid schizophrenia: Principal | ICD-10-CM

## 2023-11-07 LAB — HEPATITIS B SURFACE ANTIGEN: Hepatitis B Surface Ag: NONREACTIVE

## 2023-11-07 MED ORDER — LOPERAMIDE HCL 2 MG PO CAPS: 2.0000 mg | ORAL_CAPSULE | ORAL | Status: AC | PRN

## 2023-11-07 MED ORDER — HYDROXYZINE HCL 25 MG PO TABS
25.0000 mg | ORAL_TABLET | Freq: Three times a day (TID) | ORAL | Status: DC | PRN
  Administered 2023-11-11 – 2023-11-12 (×2): 25 mg via ORAL
  Filled 2023-11-07 (×3): qty 1

## 2023-11-07 MED ORDER — LORAZEPAM 1 MG PO TABS: 1.0000 mg | ORAL_TABLET | Freq: Four times a day (QID) | ORAL | Status: AC | PRN

## 2023-11-07 MED ORDER — ADULT MULTIVITAMIN W/MINERALS CH
1.0000 | ORAL_TABLET | Freq: Every day | ORAL | Status: DC
  Administered 2023-11-07 – 2023-11-13 (×7): 1 via ORAL
  Filled 2023-11-07 (×10): qty 1

## 2023-11-07 MED ORDER — VITAMIN B-1 100 MG PO TABS
100.0000 mg | ORAL_TABLET | Freq: Every day | ORAL | Status: DC
  Administered 2023-11-08 – 2023-11-13 (×6): 100 mg via ORAL
  Filled 2023-11-07 (×9): qty 1

## 2023-11-07 MED ORDER — NICOTINE POLACRILEX 2 MG MT GUM
2.0000 mg | CHEWING_GUM | OROMUCOSAL | Status: DC | PRN
  Administered 2023-11-07 – 2023-11-13 (×15): 2 mg via ORAL
  Filled 2023-11-07 (×7): qty 1

## 2023-11-07 MED ORDER — ONDANSETRON 4 MG PO TBDP: 4.0000 mg | ORAL_TABLET | Freq: Four times a day (QID) | ORAL | Status: AC | PRN

## 2023-11-07 MED ORDER — OLANZAPINE 10 MG PO TABS
10.0000 mg | ORAL_TABLET | Freq: Two times a day (BID) | ORAL | Status: DC
  Administered 2023-11-07 – 2023-11-13 (×12): 10 mg via ORAL
  Filled 2023-11-07 (×8): qty 1
  Filled 2023-11-07: qty 14
  Filled 2023-11-07 (×2): qty 1
  Filled 2023-11-07: qty 14
  Filled 2023-11-07: qty 1
  Filled 2023-11-07 (×2): qty 14
  Filled 2023-11-07 (×9): qty 1

## 2023-11-07 MED ORDER — HYDROXYZINE HCL 25 MG PO TABS: 25.0000 mg | ORAL_TABLET | Freq: Four times a day (QID) | ORAL | Status: DC | PRN

## 2023-11-07 NOTE — BH IP Treatment Plan (Signed)
 Interdisciplinary Treatment and Diagnostic Plan Update  11/07/2023 Time of Session: 1044 TAKSH HJORT MRN: [id number]  Principal Diagnosis: Paranoid schizophrenia [facility])  Secondary Diagnoses: Principal Problem:   Paranoid schizophrenia (HCC)   Current Medications:  Current Facility-Administered Medications  Medication Dose Route Frequency Provider Last Rate Last Admin   acetaminophen (TYLENOL) tablet 650 mg  650 mg Oral Q6H PRN Marlou Sa, NP       alum & mag hydroxide-simeth (MAALOX/MYLANTA) 200-200-20 MG/5ML suspension 30 mL  30 mL Oral Q4H PRN Rayburn Go, Veronique M, NP       haloperidol (HALDOL) tablet 5 mg  5 mg Oral TID PRN Marlou Sa, NP       And   diphenhydrAMINE (BENADRYL) capsule 50 mg  50 mg Oral TID PRN Marlou Sa, NP       haloperidol lactate (HALDOL) injection 5 mg  5 mg Intramuscular TID PRN Marlou Sa, NP       And   diphenhydrAMINE (BENADRYL) injection 50 mg  50 mg Intramuscular TID PRN Marlou Sa, NP       And   LORazepam (ATIVAN) injection 2 mg  2 mg Intramuscular TID PRN Marlou Sa, NP       haloperidol lactate (HALDOL) injection 10 mg  10 mg Intramuscular TID PRN Marlou Sa, NP       And   diphenhydrAMINE (BENADRYL) injection 50 mg  50 mg Intramuscular TID PRN Marlou Sa, NP       And   LORazepam (ATIVAN) injection 2 mg  2 mg Intramuscular TID PRN Marlou Sa, NP       OLANZapine (ZYPREXA) tablet 5 mg  5 mg Oral BID Rayburn Go, Veronique M, NP   5 mg at 11/07/23 8657   traZODone (DESYREL) tablet 50 mg  50 mg Oral QHS PRN Marlou Sa, NP       PTA Medications: No medications prior to admission.    Patient Stressors: Financial difficulties   Medication change or noncompliance   Occupational concerns    Patient Strengths: Ability for insight  Average or above average intelligence  Communication skills   Treatment Modalities: Medication Management,  Group therapy, Case management,  1 to 1 session with clinician, Psychoeducation, Recreational therapy.   Physician Treatment Plan for Primary Diagnosis: Paranoid schizophrenia (HCC) Long Term Goal(s):     Short Term Goals:    Medication Management: Evaluate patient's response, side effects, and tolerance of medication regimen.  Therapeutic Interventions: 1 to 1 sessions, Unit Group sessions and Medication administration.  Evaluation of Outcomes: Not Progressing  Physician Treatment Plan for Secondary Diagnosis: Principal Problem:   Paranoid schizophrenia (HCC)  Long Term Goal(s):     Short Term Goals:       Medication Management: Evaluate patient's response, side effects, and tolerance of medication regimen.  Therapeutic Interventions: 1 to 1 sessions, Unit Group sessions and Medication administration.  Evaluation of Outcomes: Not Progressing   RN Treatment Plan for Primary Diagnosis: Paranoid schizophrenia (HCC) Long Term Goal(s): Knowledge of disease and therapeutic regimen to maintain health will improve  Short Term Goals: Ability to verbalize frustration and anger appropriately will improve, Ability to participate in decision making will improve, Ability to disclose and discuss suicidal ideas, and Compliance with prescribed medications will improve  Medication Management: RN will administer medications as ordered by provider, will assess and evaluate patient's response and provide education to patient for prescribed medication. RN will report any adverse and/or side  effects to prescribing provider.  Therapeutic Interventions: 1 on 1 counseling sessions, Psychoeducation, Medication administration, Evaluate responses to treatment, Monitor vital signs and CBGs as ordered, Perform/monitor CIWA, COWS, AIMS and Fall Risk screenings as ordered, Perform wound care treatments as ordered.  Evaluation of Outcomes: Not Progressing   LCSW Treatment Plan for Primary Diagnosis: Paranoid  schizophrenia (HCC) Long Term Goal(s): Safe transition to appropriate next level of care at discharge, Engage patient in therapeutic group addressing interpersonal concerns.  Short Term Goals: Engage patient in aftercare planning with referrals and resources, Increase emotional regulation, Facilitate patient progression through stages of change regarding substance use diagnoses and concerns, and Identify triggers associated with mental health/substance abuse issues  Therapeutic Interventions: Assess for all discharge needs, 1 to 1 time with Social worker, Explore available resources and support systems, Assess for adequacy in community support network, Educate family and significant other(s) on suicide prevention, Complete Psychosocial Assessment, Interpersonal group therapy.  Evaluation of Outcomes: Not Progressing   Progress in Treatment: Attending groups: No. Participating in groups: No. and As evidenced by:  Recently admitted 18 hours ago Taking medication as prescribed: Yes. Toleration medication: Yes. Family/Significant other contact made: No, will contact:  CSW assessment & consents pending Patient understands diagnosis: Yes. Discussing patient identified problems/goals with staff: Yes. Medical problems stabilized or resolved: Yes. Denies suicidal/homicidal ideation: Yes. Issues/concerns per patient self-inventory: No. Other: N/a  New problem(s) identified: No, Describe:  None  New Short Term/Long Term Goal(s): medication stabilization, elimination of SI thoughts, development of comprehensive mental wellness plan.   Patient Goals: "Get back to work"  Discharge Plan or Barriers: Patient recently admitted. CSW will continue to follow and assess for appropriate referrals and possible discharge planning.   Reason for Continuation of Hospitalization: Medication stabilization Other; describe Mood stabilization, discharge planning  Estimated Length of Stay: 5-7 DAYS  Last 3  [place] Suicide Severity Risk Score: Flowsheet Row [facility] Most recent reading at 11/06/2023  4:50 PM ED from 11/05/2023 in [facility] Most recent reading at 11/05/2023 12:38 PM ED from 11/05/2023 in [facility] Emergency Department [facility] Most recent reading at 11/05/2023  3:11 AM  C-SSRS RISK CATEGORY No Risk No Risk No Risk       Last PHQ 2/9 Scores:    11/06/2023   10:27 AM 11/05/2023   10:17 AM  Depression screen PHQ 2/9  Decreased Interest 0 0  Down, Depressed, Hopeless 0 0  PHQ - 2 Score 0 0    Scribe for Treatment Team: Jacinta Shoe, LCSW 11/07/2023 10:16 AM

## 2023-11-07 NOTE — Progress Notes (Signed)
 Pt was reluctant to allow staff to obtain labs.   Pt appeared agitated and needed prompting and redirection.  Labs were obtained and pt was verbally deescalated. Pt refused prn medication and redirectable enough not to get agitation meds at this time.

## 2023-11-07 NOTE — Group Note (Signed)
 Recreation Therapy Group Note   Group Topic:Other  Group Date: 11/07/2023 Start Time: 1025 End Time: 1111 Facilitators: Calbert Hulsebus-McCall, LRT,CTRS Location: 500 Hall Dayroom   Group Topic/Focus: Music Therapy  Goal Area(s) Addresses:  Patient will select songs that have meaning to them.  Patient will identify the benefits of music.  Intervention: Music  Activity: Music Therapy. LRT and patients discussed the benefits of music when dealing with tough situations. Patients were allowed to pick any songs of their choosing as long as they were clean and appropriate during group.    Affect/Mood: Appropriate   Participation Level: Engaged   Participation Quality: Independent   Behavior: Appropriate   Speech/Thought Process: Focused   Insight: Good   Judgement: Good   Modes of Intervention: Music   Patient Response to Interventions:  Engaged   Education Outcome:  In group clarification offered    Clinical Observations/Individualized Feedback: Pt came in late to group. Pt seemed to perk up when the group was explained to him. Pt stated music is the only thing that helps him. Pt picked a song by Buju Banton called [employer]. Pt didn't identify any specific reasons for the song, it was just a song he wanted to hear.     Plan: Continue to engage patient in RT group sessions 2-3x/week.   Latese Dufault-McCall, LRT,CTRS  11/07/2023 1:23 PM

## 2023-11-07 NOTE — Progress Notes (Signed)
 Recreation Therapy Notes  INPATIENT RECREATION THERAPY ASSESSMENT  Patient Details Name: Matthew Carney MRN: [id number] DOB: [birth year] Today's Date: 11/07/2023       Information Obtained From: Patient  Able to Participate in Assessment/Interview: Yes  Patient Presentation: Alert  Reason for Admission (Per Patient): Other (Comments) (pt stated he was in a situation to get away from somebody)  Patient Stressors: Other (Comment) ("being where I was at")  Coping Skills:   Music, Exercise, Meditate, Other (Comment), Read (jog, yoga)  Leisure Interests (2+):  Exercise - Jogging, Music - Listen, Individual - Reading  Frequency of Recreation/Participation: Other (Comment) (daily)  Awareness of Community Resources:  Yes  Community Resources:  Library, [employer], Public affairs consultant  Current Use:  (Pt stated his cell phone was his Engineering geologist so he didn't go hardly ever and other things cost money.)  If no, Barriers?:    Expressed Interest in [employer] Information: No  [employer] of Residence:  Engineer, technical sales  Patient Main Form of Transportation: Therapist, music  Patient Strengths:  Encourage/uplift people  Patient Identified Areas of Improvement:  "get rid of cold feet=speaking in front of a crowd of people"  Patient Goal for Hospitalization:  "to become better than what I was when I came in"  Current SI (including self-harm):  No  Current HI:  No  Current AVH: No  Staff Intervention Plan: Group Attendance, Collaborate with Interdisciplinary Treatment Team  Consent to Intern Participation: N/A   Marceil Welp-McCall, LRT,CTRS Quillan Whitter A Nealy Karapetian-McCall 11/07/2023, 1:47 PM

## 2023-11-07 NOTE — Progress Notes (Signed)
   11/07/23 1610  15 Minute Checks  Location Bedroom  Visual Appearance Calm  Behavior Composed  Sleep (Behavioral Health Patients Only)  Calculate sleep? (Click Yes once per 24 hr at 0600 safety check) Yes  Documented sleep last 24 hours 5.25
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
## 2023-11-07 NOTE — H&P (Addendum)
 Total Time Spent in Direct Patient Care:  I personally spent 60 minutes on the unit in direct patient care. The direct patient care time included face-to-face time with the patient, reviewing the patient's chart, communicating with other professionals, and coordinating care. Greater than 50% of this time was spent in counseling or coordinating care with the patient regarding goals of hospitalization, psycho-education, and discharge planning needs.  I personally was present and performed or re-performed the history, physical exam and medical decision-making activities of this service and have verified that the service and findings are accurately documented in the student's note, , as addended by me or notated below:  Patient is irritable and psychotic.  He is paranoid and having delusions.  He also reports having symptoms of thought control and thought insertion.  He is unsure when he last took psychiatric medications but does state he was diagnosed with paranoid schizophrenia over 10 years ago.  I discussed with the patient that with his symptoms, a higher potency D2 antagonist would probably be more effective such as Haldol Prolixin or Risperdal, but the patient declines saying that his medications are poisonous and that he does not need any medication at all because nothing is wrong with him he is just scared about people outside the hospital.  He has been accepting Zyprexa since admission to this hospital, we will continue the zyprexa and increase the dose, in hopes that he will take an antipsychotic medication at all without a forced medication order. I also ordered the exposure panel for blood-borne pathogens due to a needle stick injury that occurred in the emergency department. I also started a CIWA protocol with as needed Ativan as patient stated he has been doing alcohol, unsure when last drink was, could have been a week ago or could have been prior to presentation to the emergency department.  I  directly edited the note, as above.  See my admission SRA for the mental status examination for this patient.  I certify that inpatient services furnished can reasonably be expected to improve the patient's condition.  Matthew Inches, MD Psychiatrist      Psychiatric Admission Assessment Adult  Patient Identification: Matthew Carney MRN:  [id number] Date of Evaluation:  11/07/2023  Chief Complaint:  Paranoid schizophrenia (HCC) [F20.0],  Paranoid schizophrenia (HCC)  Principal Problem:   Paranoid schizophrenia (HCC)   History of Present Illness:  Matthew Carney is a 48 y.o. male with a past psychiatric history of paranoid schizophrenia admitted voluntarily to the [facility]) for evaluation and management of paranoia about personal safety.   On interview, Matthew Carney stated that he has a history of being able to sense energy around him since the ages of 19 to 48 years old. He reported being able to read people's mind and having telepathic powers. He reported that approximately a week ago, he heard from the energy that the people around him were trying to hurt him, including the friend that he was living with. He stated that the energy communicates with him in different ways, from voices to how animals move. Upon learning that his friend and everyone around him was trying to hurt him, he reported running to a nearby jail (patient refused to name) for safety. He stated that he went through the metal detector with illegal substances and smoked an illegal substance in front of them, but that they sent him home. Two days ago, Matthew Carney reported leaving the residence he was  staying at out of concern for his safety due to the energy. He confirmed end up in the emergency department and admitting himself. He denied any suicidal ideation, homicidal ideation, and auditory or visual hallucinations, saying that "it's normal to interact with the  energy."  Matthew Carney denied symptoms representative of depression such as low mood/anhedonia, feelings of guilt, changes in sleep, energy, appetite, or concentration, as well as psychomotor agitation.  He also denied symptoms representative of anxiety such as excessive worry, irritability, muscle tension, or restlessness. He also denied symptoms related to social anxiety and panic disorders including worry in large groups, meeting new people or 10-15 minutes episodes of heart palpitations, chest tightness, difficulty breathing, and diaphoresis.  He denied symptoms representative of bipolar disorder such as elevated mood, decreased sleep, impulsivity, pressured speech, and distractibility.  He endorsed symptoms of delusions, paranoia, and thought broadcasting that started 4 days ago, reporting that he felt like people were out to get them, that his thoughts could be read by everyone else, and that he could read everyone else's thoughts.  He denied symptoms related to PTSD including flashbacks, reliving experiences, and nightmares.   Chart review: On chart review, prior to this evaluation, patient has a history of psychiatric emergencies including one suicide attempt in June 2011 via jumping off a bridge and one past involuntary commitment for schizophrenia in Jan 2016.  Subjective Sleep past 24 hours: good Subjective Appetite past 24 hours: good  Collateral information obtained (Patient denied having anyone to call) Patient reported there was no one who could provide more insight into his situation outside of himself.   Past Psychiatric History:  Previous psych diagnoses:  Patient reported history of paranoid schizophrenia, diagnosed in 2007. Patient reported history of MDD, diagnosed at unknown time Prior inpatient psychiatric treatment:  Patient reported 2 prior inpatient psychiatric treatments - 1x in Jun 2011 for suicide attempt and 1x in Jan 2016 for schizophrenia Prior outpatient  psychiatric treatment: Denies Current psychiatric provider: Denies  Neuromodulation history: denies  Current therapist: Denies Psychotherapy hx: Denies  History of suicide attempts:  Patient reported 1x history of suicide attempt in Jun 2011 via jumping off a bridge History of homicide: Denies  Psychotropic medications: Current Patient denied taking any current medications prior to hospitalization  Past Patient endorsed taking medications in the past, but denies knowing which medications, their efficacies, and side effects  Substance Use History: Alcohol:  Patient endorsed use until about a week ago. Patient described drinking up to a bottle of liquor, but refused to elaborate about the size.  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 Yes Has the patient been  a risk to self in the past 6 months? Unknown Has the patient been a risk to self within the distant past? Unknown Is the patient a risk to others? Unknown Has the patient been a risk to others in the  past 6 months? Unknown Has the patient been a risk to others within the distant past? Unknown  Alcohol Screening: Patient refused Alcohol Screening Tool: Yes 1. How often do you have a drink containing alcohol?: Never 2. How many drinks containing alcohol do you have on a typical day when you are drinking?: 1 or 2 3. How often do you have six or more drinks on one occasion?: Never AUDIT-C Score: 0 4. How often during the last year have you found that you were not able to stop drinking once you had started?: Never 5. How often during the last year have you failed to do what was normally expected from you because of drinking?: Never 6. How often during the last year have you needed a first drink in the morning to get yourself going after a heavy drinking session?: Never 7. How often during the last year have you had a feeling of guilt of remorse after drinking?: Never 8. How often during the last year have you been unable to remember what happened the night before because you had been drinking?: Never 9. Have you or someone else been injured as a result of your drinking?: No 10. Has a relative or friend or a doctor or another health worker been concerned about your drinking or suggested you cut down?: No Alcohol Use Disorder Identification Test Final Score (AUDIT): 0 Alcohol Brief Interventions/Follow-up: Patient Refused Tobacco Screening:    Substance Abuse History in the last 12 months: No  Allergies: patient endorses no known allergies to medications  Past Medical/Surgical History:  Medical Diagnoses: Patient reported no known medical diagnoses. Home Rx: Patient denied taking any medications. Prior Hosp: Patient endorsed one previous emergency department visit for possible allergy to haldol. Prior Surgeries / non-head trauma: Patient denied.  Head trauma:  Patient denied LOC:  Patient denied Concussions:  Patient denied Seizures:  Patient denied  Last menstrual period and  contraceptives: Patient denied using contraception.  Family History:  Medical: Patient endorsed history of diabetes in sister who passed. Psych: Patient endorsed history of psychiatric conditions in his family, but denied explaining any further. Psych Rx: Patient denied knowing any medications regarding his family. Suicide: Patient endorsed one history of suicide attempt in his sister who passed. Homicide: Patient denied. Substance use family hx: Patient endorsed history of substance use in his family, but denied explaining any further.  Social History:  Place of birth and grew up where: Patient reported being born and growing up in [place], [place] Abuse: Patient described his childhood as rough, but declined to elaborate. 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 Highest level of education: 8th grade Housing:  Reported to be living with a friend until current hospitalization Finances: no reliable source of income Legal: Previously incarcerated multiple times- once for a span of 10 years, another span of 4.5 years, and most recently for 8 years Military: never served Consulting civil engineer: denies owning any firearms Pills stockpile: endorses having a stockpile of unspecified pills, but reported that he "would never take them"  Lab Results:  Results for orders placed or performed during the hospital encounter of 11/05/23 (from the past 48  hours)  POCT Urine Drug Screen - (I-Screen)     Status: Abnormal   Collection Time: 11/05/23 11:20 AM  Result Value Ref Range   POC Amphetamine UR None Detected NONE DETECTED (Cut Off Level 1000 ng/mL)   POC Secobarbital (BAR) None Detected NONE DETECTED (Cut Off Level 300 ng/mL)   POC Buprenorphine (BUP) None Detected NONE DETECTED (Cut Off Level 10 ng/mL)   POC Oxazepam (BZO) None Detected NONE  DETECTED (Cut Off Level 300 ng/mL)   POC Cocaine UR None Detected NONE DETECTED (Cut Off Level 300 ng/mL)   POC Methamphetamine UR None Detected NONE DETECTED (Cut Off Level 1000 ng/mL)   POC Morphine None Detected NONE DETECTED (Cut Off Level 300 ng/mL)   POC Methadone UR None Detected NONE DETECTED (Cut Off Level 300 ng/mL)   POC Oxycodone UR None Detected NONE DETECTED (Cut Off Level 100 ng/mL)   POC Marijuana UR Positive (A) NONE DETECTED (Cut Off Level 50 ng/mL)  Urinalysis, Routine w reflex microscopic -Urine, Clean Catch     Status: Abnormal   Collection Time: 11/05/23 11:25 AM  Result Value Ref Range   Color, Urine YELLOW YELLOW   APPearance CLEAR CLEAR   Specific Gravity, Urine >1.030 (H) 1.005 - 1.030   pH 6.0 5.0 - 8.0   Glucose, UA NEGATIVE NEGATIVE mg/dL   Hgb urine dipstick NEGATIVE NEGATIVE   Bilirubin Urine NEGATIVE NEGATIVE   Ketones, ur 15 (A) NEGATIVE mg/dL   Protein, ur 295 (A) NEGATIVE mg/dL   Nitrite NEGATIVE NEGATIVE   Leukocytes,Ua NEGATIVE NEGATIVE    Comment: Performed at [facility], [address] [place], [place] [address]  Urinalysis, Microscopic (reflex)     Status: Abnormal   Collection Time: 11/05/23 11:25 AM  Result Value Ref Range   RBC / HPF 0-5 0 - 5 RBC/hpf   WBC, UA NONE SEEN 0 - 5 WBC/hpf   Bacteria, UA RARE (A) NONE SEEN   Squamous Epithelial / HPF NONE SEEN 0 - 5 /HPF   Mucus PRESENT     Comment: Performed at [facility], [address] [place], [place] [address]  CBC with Differential/Platelet     Status: None   Collection Time: 11/05/23 11:29 AM  Result Value Ref Range   WBC 7.6 4.0 - 10.5 K/uL   RBC 4.87 4.22 - 5.81 MIL/uL   Hemoglobin 15.6 13.0 - 17.0 g/dL   HCT 46.9 62.9 - 52.8 %   MCV 97.3 80.0 - 100.0 fL   MCH 32.0 26.0 - 34.0 pg   MCHC 32.9 30.0 - 36.0 g/dL   RDW 41.3 24.4 - 01.0 %   Platelets 235 150 - 400 K/uL   nRBC 0.0 0.0 - 0.2 %   Neutrophils Relative % 68 %   Neutro Abs 5.1 1.7 - 7.7 K/uL    Lymphocytes Relative 26 %   Lymphs Abs 1.9 0.7 - 4.0 K/uL   Monocytes Relative 6 %   Monocytes Absolute 0.5 0.1 - 1.0 K/uL   Eosinophils Relative 0 %   Eosinophils Absolute 0.0 0.0 - 0.5 K/uL   Basophils Relative 0 %   Basophils Absolute 0.0 0.0 - 0.1 K/uL   Immature Granulocytes 0 %   Abs Immature Granulocytes 0.03 0.00 - 0.07 K/uL    Comment: Performed at [facility], [address] [place], [place] [address]  Comprehensive metabolic panel     Status: Abnormal   Collection Time: 11/05/23 11:29 AM  Result Value  Ref Range   Sodium 138 135 - 145 mmol/L   Potassium 4.5 3.5 - 5.1 mmol/L   Chloride 100 98 - 111 mmol/L   CO2 27 22 - 32 mmol/L   Glucose, Bld 92 70 - 99 mg/dL    Comment: Glucose reference range applies only to samples taken after fasting for at least 8 hours.   BUN 5 (L) 6 - 20 mg/dL   Creatinine, Ser 2.95 0.61 - 1.24 mg/dL   Calcium 62.1 8.9 - 30.8 mg/dL   Total Protein 7.8 6.5 - 8.1 g/dL   Albumin 4.5 3.5 - 5.0 g/dL   AST 24 15 - 41 U/L   ALT 18 0 - 44 U/L   Alkaline Phosphatase 69 38 - 126 U/L   Total Bilirubin 0.8 0.0 - 1.2 mg/dL   GFR, Estimated >65 >78 mL/min    Comment: (NOTE) Calculated using the CKD-EPI Creatinine Equation (2021)    Anion gap 11 5 - 15    Comment: Performed at [facility], [address], [place], [place] [address]  Hemoglobin A1c     Status: Abnormal   Collection Time: 11/05/23 11:29 AM  Result Value Ref Range   Hgb A1c MFr Bld 4.4 (L) 4.8 - 5.6 %    Comment: (NOTE) Pre diabetes:          5.7%-6.4%  Diabetes:              >6.4%  Glycemic control for   <7.0% adults with diabetes    Mean Plasma Glucose 79.58 mg/dL    Comment: Performed at [facility], [address], [place], [place] [address]  Magnesium     Status: None   Collection Time: 11/05/23 11:29 AM  Result Value Ref Range   Magnesium 2.3 1.7 - 2.4 mg/dL    Comment: Performed at [facility], [address] [place], [place] [address]  Ethanol      Status: None   Collection Time: 11/05/23 11:29 AM  Result Value Ref Range   Alcohol, Ethyl (B) <10 <10 mg/dL    Comment: (NOTE) Lowest detectable limit for serum alcohol is 10 mg/dL.  For medical purposes only. Performed at [facility], [address], [place], [place] [address]   Lipid panel     Status: Abnormal   Collection Time: 11/05/23 11:29 AM  Result Value Ref Range   Cholesterol 189 0 - 200 mg/dL   Triglycerides 80 <253 mg/dL   HDL 68 >66 mg/dL   Total CHOL/HDL Ratio 2.8 RATIO   VLDL 16 0 - 40 mg/dL   LDL Cholesterol 440 (H) 0 - 99 mg/dL    Comment:        Total Cholesterol/HDL:CHD Risk Coronary Heart Disease Risk Table                     Men   Women  1/2 Average Risk   3.4   3.3  Average Risk       5.0   4.4  2 X Average Risk   9.6   7.1  3 X Average Risk  23.4   11.0        Use the calculated Patient Ratio above and the CHD Risk Table to determine the patient's CHD Risk.        ATP III CLASSIFICATION (LDL):  <100     mg/dL   Optimal  347-425  mg/dL   Near or Above  Optimal  130-159  mg/dL   Borderline  132-440  mg/dL   High  >102     mg/dL   Very High Performed at [facility], [address], [place], [place] [address]   TSH     Status: None   Collection Time: 11/05/23 11:29 AM  Result Value Ref Range   TSH 1.685 0.350 - 4.500 uIU/mL    Comment: Performed by a 3rd Generation assay with a functional sensitivity of <=0.01 uIU/mL. Performed at [facility], [address], [place], [place] [address]   RPR     Status: None   Collection Time: 11/05/23 11:29 AM  Result Value Ref Range   RPR Ser Ql NON REACTIVE NON REACTIVE    Comment: Performed at [facility], [address], [place], [place] [address]    Blood Alcohol level:  Lab Results  Component Value Date   [facility] <10 11/05/2023   ETH <5 08/24/2014    Metabolic Disorder Labs:  Lab Results  Component Value Date   HGBA1C 4.4 (L) 11/05/2023   MPG  79.58 11/05/2023   No results found for: "PROLACTIN" Lab Results  Component Value Date   CHOL 189 11/05/2023   TRIG 80 11/05/2023   HDL 68 11/05/2023   CHOLHDL 2.8 11/05/2023   VLDL 16 11/05/2023   LDLCALC 105 (H) 11/05/2023    Current Medications: Current Facility-Administered Medications  Medication Dose Route Frequency Provider Last Rate Last Admin   acetaminophen (TYLENOL) tablet 650 mg  650 mg Oral Q6H PRN Marlou Sa, NP       alum & mag hydroxide-simeth (MAALOX/MYLANTA) 200-200-20 MG/5ML suspension 30 mL  30 mL Oral Q4H PRN Rayburn Go, Veronique M, NP       haloperidol (HALDOL) tablet 5 mg  5 mg Oral TID PRN Marlou Sa, NP       And   diphenhydrAMINE (BENADRYL) capsule 50 mg  50 mg Oral TID PRN Marlou Sa, NP       haloperidol lactate (HALDOL) injection 5 mg  5 mg Intramuscular TID PRN Marlou Sa, NP       And   diphenhydrAMINE (BENADRYL) injection 50 mg  50 mg Intramuscular TID PRN Marlou Sa, NP       And   LORazepam (ATIVAN) injection 2 mg  2 mg Intramuscular TID PRN Marlou Sa, NP       haloperidol lactate (HALDOL) injection 10 mg  10 mg Intramuscular TID PRN Marlou Sa, NP       And   diphenhydrAMINE (BENADRYL) injection 50 mg  50 mg Intramuscular TID PRN Marlou Sa, NP       And   LORazepam (ATIVAN) injection 2 mg  2 mg Intramuscular TID PRN Marlou Sa, NP       OLANZapine (ZYPREXA) tablet 5 mg  5 mg Oral BID Rayburn Go, Veronique M, NP   5 mg at 11/07/23 9563   traZODone (DESYREL) tablet 50 mg  50 mg Oral QHS PRN Marlou Sa, NP        PTA Medications: No medications prior to admission.    Physical Findings: AIMS: No  CIWA:    COWS:     Psychiatric Specialty Exam: General Appearance:  Middle-aged, groomed male sitting on bed.   Eye Contact:  Fair   Speech:  Clear and Coherent   Volume:  Increased   Mood:  "Pretty good so far"   Affect:  Labile   Thought Content:  Delusions, paranoia, and obsessions   Suicidal Thoughts:  Suicidal Thoughts: Patient denied.   Homicidal Thoughts:  Homicidal Thoughts: Patient denied.   Thought Process:  Disorganized and circumstantial   Orientation:  Alert and oriented x4 to person, place, time, and situation     Memory:  Immediate Good; Recent Poor; Remote Poor   Judgment:  Poor   Insight:  Poor   Concentration:  Poor   Recall:  Good   Fund of Knowledge:  Poor   Language:  Poor   Psychomotor Activity:  Psychomotor Activity: Restlessness   Assets:  Desire for Improvement; Communication Skills; Physical Health   Sleep:  Sleep: Patient reported good.    Review of Systems Review of Systems  Constitutional:  Negative for chills and fever.  HENT:  Negative for hearing loss and sore throat.   Eyes:  Negative for blurred vision and double vision.  Respiratory:  Negative for cough, shortness of breath and wheezing.   Cardiovascular:  Negative for chest pain and palpitations.  Gastrointestinal:  Negative for abdominal pain, constipation, diarrhea, nausea and vomiting.  Genitourinary:  Negative for dysuria.  Musculoskeletal:  Negative for myalgias.  Neurological:  Negative for dizziness and headaches.  Psychiatric/Behavioral:  Positive for hallucinations. Negative for depression and suicidal ideas. The patient is not nervous/anxious.     Vital signs: Blood pressure (!) 128/92, pulse 91, temperature 97.8 F (36.6 C), temperature source Oral, resp. rate 18, height 5\' 11"  (1.803 m), weight 72.6 kg, SpO2 99%. Body mass index is 22.32 kg/m.  Physical Exam Constitutional:      Appearance: Normal appearance.  Eyes:     Conjunctiva/sclera: Conjunctivae normal.  Pulmonary:     Effort: Pulmonary effort is normal.  Musculoskeletal:     Cervical back: Normal range of motion.  Neurological:     Mental Status: He is oriented to person, place, and time.      Assets  Assets:Desire for Improvement; Communication Skills; Physical Health   Treatment Plan Summary: Daily contact with patient to assess and evaluate symptoms and progress in treatment and medication management  ASSESSMENT:   DIAGNOSIS: Schizophrenia, paranoid type  PLAN: Safety and Monitoring:  -- Voluntary admission to inpatient psychiatric unit for safety, stabilization and treatment  -- Daily contact with patient to assess and evaluate symptoms and progress in treatment  -- Patient's case to be discussed in multi-disciplinary team meeting  -- Observation Level : q15 minute checks  -- Vital signs: q12 hours  -- Precautions: suicide, elopement, and assault  2. Interventions (medications, psychoeducation, etc):               -- medical regimen:    Increase Zyprexa from 5 mg to 10 mg twice daily for schizophrenia.  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  We will try to stabilize patient with this antipsychotic medication without a forced medication order, as he has been accepting Zyprexa since admission to this unit.   PRN medications for symptomatic management:              -- start acetaminophen 650 mg every 6 hours as needed for mild to moderate pain, fever, and headaches              -- start hydroxyzine 25 mg three times a day as needed for anxiety              --  start bismuth subsalicylate 524 mg oral chewable tablet every 3 hours as needed for indigestion              -- start senna 8.6 mg oral at bedtime as needed and polyethylene glycol 17 g oral daily as needed for mild to moderate constipation              -- start ondansetron 8 mg every 8 hours as needed for nausea or vomiting              -- start aluminum-magnesium hydroxide + simethicone 30 mL every 4 hours as needed for heartburn               -- start trazodone 50 mg at bedtime as needed for insomnia  -- As needed agitation protocol in-place, switching haldol to zyprexa for all PRN  The risks/benefits/side-effects/alternatives to the above medication were discussed in detail with the patient and time was given for questions. The patient consents to medication trial. FDA black box warnings, if present, were discussed.  The patient is agreeable with the medication plan, as above. We will monitor the patient's response to pharmacologic treatment, and adjust medications as necessary.  3. Routine and other pertinent labs: EKG monitoring: QTc: No recent EKG data; last EKG in 2015 with QTC of 429  Metabolism / endocrine: BMI: Body mass index is 22.32 kg/m. Prolactin: No results found for: "PROLACTIN" Lipid Panel: Lab Results  Component Value Date   CHOL 189 11/05/2023   TRIG 80 11/05/2023   HDL 68 11/05/2023   CHOLHDL 2.8 11/05/2023   VLDL 16 11/05/2023   LDLCALC 105 (H) 11/05/2023   HbgA1c: Hgb A1c MFr Bld (%)  Date Value  11/05/2023 4.4 (L)   TSH: TSH (uIU/mL)  Date Value  11/05/2023 1.685  11/10/2009    0.876 (NOTE) ** Please note change in reference ranges for ages 14W to 80Y. ** **Test methodology is 3rd generation TSH**    Drugs of Abuse     Component Value Date/Time   LABOPIA NONE DETECTED 08/24/2014 0108   COCAINSCRNUR NONE DETECTED 08/24/2014 0108   COCAINSCRNUR NEGATIVE 11/10/2009 1054   LABBENZ NONE DETECTED 08/24/2014 0108   LABBENZ NEGATIVE 11/10/2009 1054   AMPHETMU NONE DETECTED 08/24/2014 0108   THCU NONE DETECTED 08/24/2014 0108   LABBARB NONE DETECTED 08/24/2014 0108     4. Group Therapy:  -- Encouraged patient to participate in unit milieu and in scheduled group therapies   -- Short Term Goals: Ability to identify changes in lifestyle to reduce recurrence of condition, verbalize feelings, identify and develop effective coping behaviors, maintain clinical measurements within normal  limits, and identify triggers associated with substance abuse/mental health issues will improve. Improvement in ability to disclose and discuss suicidal ideas, demonstrate self-control, and comply with prescribed medications.  -- Long Term Goals: Improvement in symptoms so as ready for discharge -- Patient is encouraged to participate in group therapy while admitted to the psychiatric unit. -- We will address other chronic and acute stressors, which contributed to the patient's Paranoid schizophrenia (HCC) in order to reduce the risk of self-harm at discharge.  5. Discharge Planning:   -- Social work and case management to assist with discharge planning and identification of hospital follow-up needs prior to discharge  -- Estimated LOS: 7-10 days  -- Discharge Concerns: Need to establish a safety plan; Medication compliance and effectiveness  -- Discharge Goals: Return home with outpatient referrals for mental health follow-up including medication management/psychotherapy  Signed: Janith Lima, Medical Student 11/07/2023, 10:41 AM

## 2023-11-07 NOTE — [facility] Group Notes (Signed)
 [facility] Group Notes:  (Nursing/MHT/Case Management/Adjunct)  Date:  11/07/2023  Time:  8:28 PM  Type of Therapy:  Psychoeducational Skills  Participation Level:  Active  Participation Quality:  Appropriate  Affect:  Appropriate  Cognitive:  Appropriate  Insight:  Good  Engagement in Group:  Engaged  Modes of Intervention:  Education  Summary of Progress/Problems: Patient rated his day as a 7 out of 10. He states that he was able to fit some "studying" today and was able to fit in "meditation" as well.   Hazle Coca S 11/07/2023, 8:28 PM

## 2023-11-07 NOTE — Plan of Care (Signed)
  Problem: Education: Goal: Knowledge of [facility] Education information/materials will improve Outcome: Progressing Goal: Emotional status will improve Outcome: Progressing Goal: Mental status will improve Outcome: Progressing Goal: Verbalization of understanding the information provided will improve Outcome: Progressing   Problem: Activity: Goal: Interest or engagement in activities will improve Outcome: Progressing Goal: Sleeping patterns will improve Outcome: Progressing   Problem: Coping: Goal: Ability to verbalize frustrations and anger appropriately will improve Outcome: Progressing Goal: Ability to demonstrate self-control will improve Outcome: Progressing   Problem: Health Behavior/Discharge Planning: Goal: Identification of resources available to assist in meeting health care needs will improve Outcome: Progressing Goal: Compliance with treatment plan for underlying cause of condition will improve Outcome: Progressing   Problem: Physical Regulation: Goal: Ability to maintain clinical measurements within normal limits will improve Outcome: Progressing   Problem: Safety: Goal: Periods of time without injury will increase Outcome: Progressing   Problem: Education: Goal: Knowledge of [facility] Education information/materials will improve Outcome: Progressing Goal: Emotional status will improve Outcome: Progressing Goal: Mental status will improve Outcome: Progressing Goal: Verbalization of understanding the information provided will improve Outcome: Progressing   Problem: Activity: Goal: Interest or engagement in activities will improve Outcome: Progressing Goal: Sleeping patterns will improve Outcome: Progressing   Problem: Coping: Goal: Ability to verbalize frustrations and anger appropriately will improve Outcome: Progressing Goal: Ability to demonstrate self-control will improve Outcome: Progressing   Problem: Health  Behavior/Discharge Planning: Goal: Identification of resources available to assist in meeting health care needs will improve Outcome: Progressing Goal: Compliance with treatment plan for underlying cause of condition will improve Outcome: Progressing   Problem: Physical Regulation: Goal: Ability to maintain clinical measurements within normal limits will improve Outcome: Progressing   Problem: Safety: Goal: Periods of time without injury will increase Outcome: Progressing   Problem: Coping: Goal: Coping ability will improve Outcome: Progressing Goal: Will verbalize feelings Outcome: Progressing   Problem: Health Behavior/Discharge Planning: Goal: Compliance with prescribed medication regimen will improve Outcome: Progressing   Problem: Nutritional: Goal: Ability to achieve adequate nutritional intake will improve Outcome: Progressing   Problem: Role Relationship: Goal: Ability to communicate needs accurately will improve Outcome: Progressing Goal: Ability to interact with others will improve Outcome: Progressing   Problem: Safety: Goal: Ability to redirect hostility and anger into socially appropriate behaviors will improve Outcome: Progressing Goal: Ability to remain free from injury will improve Outcome: Progressing   Problem: Self-Care: Goal: Ability to participate in self-care as condition permits will improve Outcome: Progressing   Problem: Self-Concept: Goal: Will verbalize positive feelings about self Outcome: Progressing   Problem: Coping: Goal: Demonstration of participation in decision-making regarding own care will improve Outcome: Progressing   Problem: Health Behavior/Discharge Planning: Goal: Identification of resources available to assist in meeting health care needs will improve Outcome: Progressing   Problem: Self-Concept: Goal: Will verbalize positive feelings about self Outcome: Progressing   Problem: Coping: Goal: Ability to identify  and develop effective coping behavior will improve Outcome: Progressing

## 2023-11-07 NOTE — Progress Notes (Signed)
 DAR NOTE: Patient presents with anxious affect and mood.  Denies suicidal thoughts, auditory and visual hallucinations.  Rates depression at 0, hopelessness at 0, and anxiety at 0.  Maintained on routine safety checks.  Medications given as prescribed.  Support and encouragement offered as needed.  Attended group and participated.  States goal for today is "to be a better man and uplift people."  Patient interacting well with staff in milieu.  Offered no complaint.

## 2023-11-07 NOTE — [facility] Group Notes (Signed)
 Adult Psychoeducational Group Note  Date:  11/07/2023 Time:  5:48 PM  Group Topic/Focus:  Goals Group:   The focus of this group is to help patients establish daily goals to achieve during treatment and discuss how the patient can incorporate goal setting into their daily lives to aide in recovery. Orientation:   The focus of this group is to educate the patient on the purpose and policies of crisis stabilization and provide a format to answer questions about their admission.  The group details unit policies and expectations of patients while admitted.  Participation Level:  Active  Participation Quality:  Appropriate  Affect:  Appropriate  Cognitive:  Appropriate  Insight: Appropriate  Engagement in Group:  Engaged  Modes of Intervention:  Discussion  Additional Comments:  Pt attended the goals group and remained appropriate and engaged throughout the duration of the group.   Sheran Lawless 11/07/2023, 5:48 PM

## 2023-11-07 NOTE — [facility] Suicide Risk Assessment (Signed)
 [facility] Admission Suicide Risk Assessment   Nursing information obtained from:  Patient Demographic factors:  Male Current Mental Status:  NA Loss Factors:  Financial problems / change in socioeconomic status Historical Factors:  NA Risk Reduction Factors:  NA  Total Time spent with patient: 30 minutes Principal Problem: Paranoid schizophrenia (HCC) Diagnosis:  Principal Problem:   Paranoid schizophrenia (HCC)  Subjective Data: See H&P. Pt is psychotic, paranoid, delusional, having symptoms of thought control/insertion. Denying SI,HI. H/o paranoid schizophrenia, current untreated for some time.   Continued Clinical Symptoms:  Alcohol Use Disorder Identification Test Final Score (AUDIT): 0 The "Alcohol Use Disorders Identification Test", Guidelines for Use in Primary Care, Second Edition.  World Science writer [facility]). Score between 0-7:  no or low risk or alcohol related problems. Score between 8-15:  moderate risk of alcohol related problems. Score between 16-19:  high risk of alcohol related problems. Score 20 or above:  warrants further diagnostic evaluation for alcohol dependence and treatment.   CLINICAL FACTORS:   Severe Anxiety and/or Agitation Schizophrenia:   Paranoid or undifferentiated type More than one psychiatric diagnosis Currently Psychotic Unstable or Poor Therapeutic Relationship Previous Psychiatric Diagnoses and Treatments     Psychiatric Specialty Exam:  Presentation  General Appearance:  Disheveled  Eye Contact: Fair  Speech: Pressured  Speech Volume: Normal  Handedness: Right   Mood and Affect  Mood: Anxious; Irritable  Affect: Labile   Thought Process  Thought Processes: Disorganized  Descriptions of Associations:Tangential  Orientation:Partial  Thought Content:Delusions; Paranoid Ideation  History of Schizophrenia/Schizoaffective disorder:Yes  Duration of Psychotic Symptoms:Greater than six  months  Hallucinations:Hallucinations: None  Ideas of Reference:Paranoia; Delusions  Suicidal Thoughts:Suicidal Thoughts: No  Homicidal Thoughts:Homicidal Thoughts: No   Sensorium  Memory: Immediate Fair; Recent Fair; Remote Good  Judgment: Impaired  Insight: Lacking   Executive Functions  Concentration: Poor  Attention Span: Poor  Recall: Poor  Fund of Knowledge: Poor  Language: Poor   Psychomotor Activity  Psychomotor Activity: Psychomotor Activity: Normal   Assets  Assets: Desire for Improvement; Communication Skills; Physical Health   Sleep  Sleep: Sleep: Fair    Physical Exam: Physical Exam Vitals reviewed.  Constitutional:      General: He is not in acute distress.    Appearance: He is normal weight. He is not toxic-appearing.  Pulmonary:     Effort: Pulmonary effort is normal. No respiratory distress.  Neurological:     Mental Status: He is alert.     Motor: No weakness.     Gait: Gait normal.    Review of Systems  Constitutional:  Negative for chills and fever.  Cardiovascular:  Negative for chest pain and palpitations.  Neurological:  Negative for dizziness, tingling, tremors and headaches.  Psychiatric/Behavioral:  Negative for depression, hallucinations, memory loss, substance abuse and suicidal ideas. The patient is nervous/anxious. The patient does not have insomnia.        Psychotic, paranoid  All other systems reviewed and are negative.  Blood pressure (!) 128/92, pulse 91, temperature 97.8 F (36.6 C), temperature source Oral, resp. rate 18, height 5\' 11"  (1.803 m), weight 72.6 kg, SpO2 99%. Body mass index is 22.32 kg/m.   COGNITIVE FEATURES THAT CONTRIBUTE TO RISK:  None    SUICIDE RISK:   Mild:  There are no identifiable suicide plans, no associated intent, moderate dysphoria and related symptoms, poor self-control (both objective and subjective assessment), few other risk factors, and identifiable protective  factors, including available and accessible social support.  PLAN OF CARE: See H&P    I certify that inpatient services furnished can reasonably be expected to improve the patient's condition.   Matthew Inches, MD 11/07/2023, 1:48 PM

## 2023-11-07 NOTE — [facility] Suicide Risk Assessment (Signed)
 [facility] INPATIENT:  Family/Significant Other Suicide Prevention Education  Suicide Prevention Education:  Patient Refusal for Family/Significant Other Suicide Prevention Education: The patient Matthew Carney has refused to provide written consent for family/significant other to be provided Family/Significant Other Suicide Prevention Education during admission and/or prior to discharge.    Patient said that phone numbers for his family members are in his phone.   Olivia Royse O Garner Dullea, LCSWA 11/07/2023, 3:19 PM

## 2023-11-07 NOTE — Plan of Care (Signed)
   Problem: Education: Goal: Emotional status will improve Outcome: Progressing Goal: Mental status will improve Outcome: Progressing   Problem: Activity: Goal: Sleeping patterns will improve Outcome: Progressing

## 2023-11-08 DIAGNOSIS — F200 Paranoid schizophrenia: Secondary | ICD-10-CM | POA: Diagnosis not present

## 2023-11-08 LAB — HCV AB W REFLEX TO QUANT PCR: HCV Ab: NONREACTIVE

## 2023-11-08 LAB — HCV INTERPRETATION

## 2023-11-08 MED ORDER — OLANZAPINE 5 MG PO TBDP: 5.0000 mg | ORAL_TABLET | Freq: Three times a day (TID) | ORAL | Status: DC | PRN

## 2023-11-08 MED ORDER — OLANZAPINE 10 MG IM SOLR: 10.0000 mg | Freq: Three times a day (TID) | INTRAMUSCULAR | Status: DC | PRN

## 2023-11-08 MED ORDER — OLANZAPINE 10 MG IM SOLR: 5.0000 mg | Freq: Three times a day (TID) | INTRAMUSCULAR | Status: DC | PRN

## 2023-11-08 NOTE — Progress Notes (Signed)
 Conversation with patient:   Patient is unhoused.  CSW provided patient with a list of shelter resources, and advised him to call.  He was very Adult nurse.  Patient said that he is on probation and has to visit the probation office once per week.  CSW gave him the office's phone number.   Damel Querry, LCSWA 11/08/2023

## 2023-11-08 NOTE — Progress Notes (Signed)
 Pt remains very paranoid. He was able to take his bedtime medication as prescribed but was very hesitant.    11/07/23 2030  Psych Admission Type (Psych Patients Only)  Admission Status Involuntary  Psychosocial Assessment  Patient Complaints Suspiciousness;Anxiety  Eye Contact Fair  Facial Expression Anxious  Affect Anxious;Apprehensive  Speech Logical/coherent  Interaction Cautious;Guarded  Motor Activity Slow  Appearance/Hygiene Unremarkable  Behavior Characteristics Guarded  Mood Anxious;Apprehensive;Suspicious  Thought Process  Coherency WDL  Content Paranoia  Delusions Paranoid  Perception WDL  Hallucination None reported or observed  Judgment Poor  Confusion None  Danger to Self  Current suicidal ideation? Denies  Danger to Others  Danger to Others None reported or observed

## 2023-11-08 NOTE — Group Note (Signed)
 Recreation Therapy Group Note   Group Topic:Self-Esteem  Group Date: 11/08/2023 Start Time: 1010 End Time: 1040 Facilitators: Charyl Minervini-McCall, LRT,CTRS Location: [address]   Group Topic: Self-esteem  Goal Area(s) Addresses:  Patient will identify and write at least one positive trait about themself. Patient will acknowledge the benefit of healthy self-esteem. Patient will endorse understanding of ways to increase self-esteem.   Intervention: Personalized Plate- printed license plate template, markers or colored pencils   Activity: LRT began group session with open dialogue asking the patients to define self-esteem and verbally identify positive qualities and traits people may possess. Patients were then instructed to design a personalized license plate, with words and drawings, representing at least 3 positive things about themselves. Pts were encouraged to include favorites, things they are proud of, what they enjoy doing, and dreams for their future. If a patient had a life motto or a meaningful phase that expressed their life values, pt's were asked to incorporate that into their design as well. Patients were given the opportunity to share their completed work with the group.  Education: Healthy self-esteem, Positive character traits, Accepting compliments, Leisure as competence and coping, Support Systems, Discharge planning LRT educated patients on the importance of healthy self-esteem and ways to build self-esteem. LRT addressed discharge planning reviewing positive coping skills and healthy support systems.  Education Outcome: Acknowledges education/In group clarification offered   Affect/Mood: N/A   Participation Level: Did not attend    Clinical Observations/Individualized Feedback:      Plan: Continue to engage patient in RT group sessions 2-3x/week.   Edge Mauger-McCall, LRT,CTRS 11/08/2023 12:40 PM

## 2023-11-08 NOTE — Group Note (Signed)
 Occupational Therapy Group Note  Group Topic:Coping Skills  Group Date: 11/08/2023 Start Time: 1421 End Time: 1551 Facilitators: Ted Mcalpine, OT   Group Description: Group encouraged increased engagement and participation through discussion and activity focused on "Coping Ahead." Patients were split up into teams and selected a card from a stack of positive coping strategies. Patients were instructed to act out/charade the coping skill for other peers to guess and receive points for their team. Discussion followed with a focus on identifying additional positive coping strategies and patients shared how they were going to cope ahead over the weekend while continuing hospitalization stay.  Therapeutic Goal(s): Identify positive vs negative coping strategies. Identify coping skills to be used during hospitalization vs coping skills outside of hospital/at home Increase participation in therapeutic group environment and promote engagement in treatment   Participation Level: Engaged   Participation Quality: Independent   Behavior: Appropriate   Speech/Thought Process: Relevant   Affect/Mood: Appropriate   Insight: Fair   Judgement: Fair      Modes of Intervention: Education  Patient Response to Interventions:  Attentive   Plan: Continue to engage patient in OT groups 2 - 3x/week.  11/08/2023  Ted Mcalpine, OT Kerrin Champagne, OT

## 2023-11-08 NOTE — Group Note (Signed)
 Date:  11/08/2023 Time:  9:13 PM  Group Topic/Focus:  Wrap-Up Group:   The focus of this group is to help patients review their daily goal of treatment and discuss progress on daily workbooks.    Participation Level:  Active  Participation Quality:  Appropriate  Affect:  Appropriate  Cognitive:  Appropriate  Insight: Appropriate  Engagement in Group:  Engaged  Modes of Intervention:  Education and Exploration  Additional Comments:  Patient attended and participated in group tonight. He reports that his goal was to be a better man today than yesterday.  He meet his goal through prayer and meditation.  Lita Mains [facility] 11/08/2023, 9:13 PM

## 2023-11-08 NOTE — [facility] Group Notes (Signed)
 Adult Psychoeducational Group Note  Date:  11/08/2023 Time:  9:57 AM  Group Topic/Focus:  Goals Group:   The focus of this group is to help patients establish daily goals to achieve during treatment and discuss how the patient can incorporate goal setting into their daily lives to aide in recovery. Orientation:   The focus of this group is to educate the patient on the purpose and policies of crisis stabilization and provide a format to answer questions about their admission.  The group details unit policies and expectations of patients while admitted.  Participation Level:  Active  Participation Quality:  Appropriate  Affect:  Appropriate  Cognitive:  Appropriate  Insight: Appropriate  Engagement in Group:  Engaged  Modes of Intervention:  Discussion  Additional Comments:  Pt attended the goals group and remained appropriate and engaged throughout the duration of the group.   Sheran Lawless 11/08/2023, 9:57 AM

## 2023-11-08 NOTE — Progress Notes (Signed)
   11/08/23 1000  Psych Admission Type (Psych Patients Only)  Admission Status Involuntary  Psychosocial Assessment  Patient Complaints Suspiciousness  Eye Contact Fair  Facial Expression Anxious  Affect Apprehensive;Anxious  Speech Logical/coherent  Interaction Guarded  Motor Activity Slow  Appearance/Hygiene Unremarkable  Behavior Characteristics Guarded  Mood Suspicious  Thought Process  Coherency WDL  Content Paranoia  Delusions Paranoid  Perception WDL  Hallucination None reported or observed  Judgment Poor  Confusion None  Danger to Self  Current suicidal ideation? Denies  Danger to Others  Danger to Others None reported or observed

## 2023-11-08 NOTE — Plan of Care (Signed)
   Problem: Education: Goal: Emotional status will improve Outcome: Not Progressing Goal: Mental status will improve Outcome: Not Progressing   Problem: Activity: Goal: Interest or engagement in activities will improve Outcome: Not Progressing

## 2023-11-08 NOTE — Progress Notes (Signed)
 [facility] Medical Student Progress Note  11/08/2023 9:42 AM JAEGER TRUEHEART  MRN:  [id number]  Principal Problem: Paranoid schizophrenia (HCC) Diagnosis: Principal Problem:   Paranoid schizophrenia (HCC)   Reason for Admission:  Matthew Carney is a 48 y.o. male with a past psychiatric history of paranoid schizophrenia admitted voluntarily to the [facility] from [facility]) for evaluation and management of paranoia about personal safety. (Patient was admitted on 11/06/2023, total  LOS: 2 days )  Chart Review from last 24 hours:  The patient's chart was reviewed and nursing notes were reviewed. The patient's case was discussed in multidisciplinary team meeting.   - Overnight events to report per chart review / staff report: no notable overnight events to report - Patient received all scheduled medications - Patient received the following PRN medications: nicotine polacrilex  Information Obtained Today During Patient Interview: The patient was seen and evaluated on the unit. On assessment today the patient reported doing better. He stated his mood was "pretty good." He reported that yesterday he spent most of the day reading, exercising, stretching/performing yoga, and meditating. He endorsed attending one group session yesterday about music that he enjoyed. He reported spending the evening walking around and relaxing. He reported sleeping "pretty good" last night for 6 hours, only waking up once at around 1 am thinking it was the morning. Nursing reported he slept for 4.5 hours. This morning, he endorsed spending most of his time reading. He stated his anxiety in the last 24 hours was 3/10, with 10 being the most severe, saying "everyone has a little anxiety throughout the day." He stated his depression in the last 24 hours was 0/10. He denied any pain or changes in his energy levels. He denied any SI. He endorsed thoughts of HI but denied a plan or intent to carry  out any actions. He reported that he had thoughts about getting into an altercation with another patient (described as "skinny white guy with short hair") because of how the patient presented to him during dinner. He reported that the other patient seemed to "square up" with him similar to inmates during his time in prison. He reported that he had spent time in prison on three different instances: once for armed robbery and kidnapping, another time for robbery, and a final time for assault kidnapping, and larceny. He denied any AVH. He stated that his appetite and hydration have been well. He endorsed a bowel movement this morning, but stated that his bowel movements have not been normal and is requesting fiber. He reported feeling a noticeably beneficial effect from his Zyprexa, feeling especially mellow and not sedated. He continued to endorse being able to communicate with the energy and voices around him, but not as much before. He reported becoming apathetic to the individuals he felt were originally out to get him. He denied experiencing any side effects, stating he preferred medications that did not sedate him or give him erectile dysfunction. He provided permission to contact his mother for collateral without restrictions.  Additionally, after denying all symptoms or withdrawal from alcohol yesterday, patient endorsed a distant history of alcohol withdrawal in 2001. He reported having experienced hallucinations and hospitalization, but denied tremors and seizures. He also reported that he had not used alcohol in the last year after reporting yesterday that his most recent alcohol use was approximately a week ago.  Collateral information obtained Anne Fu, patient's mother, [phone]) Patient provided consent to obtain collateral from his  mother without restrictions.  We attempted to contact patient's mother on 11/08/23, but received no answer. We will attempt again to obtain collateral at a  later time.   Past Psychiatric History:  Previous psych diagnoses:  Patient reported history of paranoid schizophrenia, diagnosed in 2007. Patient reported history of MDD, diagnosed at unknown time Prior inpatient psychiatric treatment:  Patient reported 2 prior inpatient psychiatric treatments - 1x in Jun 2011 for suicide attempt and 1x in Jan 2016 for schizophrenia Prior outpatient psychiatric treatment: Denies Current psychiatric provider: Denies   Neuromodulation history: denies   Current therapist: Denies Psychotherapy hx: Denies   History of suicide attempts:  Patient reported 1x history of suicide attempt in Jun 2011 via jumping off a bridge History of homicide: Denies  Past Medication Trials: Patient reported trying medications in the past, but denied knowing which medications, their efficacies, and side effects.  Past Medical History: History reviewed. No pertinent past medical history.  Family Psychiatric History:  Psych: Patient endorsed history of psychiatric conditions in his family, but denied explaining any further. Psych Rx: Patient denied knowing any medications regarding his family. Suicide: Patient endorsed one history of suicide attempt in his sister who passed. Homicide: Patient denied. Substance use family hx: Patient endorsed history of substance use in his family, but denied explaining any further.  Social History:  Place of birth and grew up where: Patient reported being born and growing up in [place], [place] Abuse: Patient described his childhood as rough, but declined to elaborate. 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 Highest level of education: 8th grade Housing:  Reported to be living with a friend until current hospitalization Finances: no reliable source of income Legal: Previously  incarcerated multiple times- once for a span of 10 years for armed robbery and kidnapping, another span of 4.5 years for robbery, and most recently for 8 years for assault, kidnapping, and Programmer, systems: never served Consulting civil engineer: denies owning any firearms Pills stockpile: endorses having a stockpile of unspecified pills, but reported that he "would never take them"  Current Medications: Current Facility-Administered Medications  Medication Dose Route Frequency Provider Last Rate Last Admin   acetaminophen (TYLENOL) tablet 650 mg  650 mg Oral Q6H PRN Marlou Sa, NP       alum & mag hydroxide-simeth (MAALOX/MYLANTA) 200-200-20 MG/5ML suspension 30 mL  30 mL Oral Q4H PRN Rayburn Go, Veronique M, NP       hydrOXYzine (ATARAX) tablet 25 mg  25 mg Oral TID PRN Massengill, Harrold Donath, MD       loperamide (IMODIUM) capsule 2-4 mg  2-4 mg Oral PRN Massengill, Harrold Donath, MD       LORazepam (ATIVAN) tablet 1 mg  1 mg Oral Q6H PRN Massengill, Harrold Donath, MD       multivitamin with minerals tablet 1 tablet  1 tablet Oral Daily Massengill, Nathan, MD   1 tablet at 11/08/23 1610   nicotine polacrilex (NICORETTE) gum 2 mg  2 mg Oral PRN Massengill, Harrold Donath, MD   2 mg at 11/08/23 0804   OLANZapine (ZYPREXA) injection 10 mg  10 mg Intramuscular TID PRN Massengill, Harrold Donath, MD       OLANZapine (ZYPREXA) injection 5 mg  5 mg Intramuscular TID PRN Massengill, Harrold Donath, MD       OLANZapine (ZYPREXA) tablet 10 mg  10 mg Oral Q12H Massengill, Harrold Donath, MD   10 mg at 11/08/23  0804   OLANZapine zydis (ZYPREXA) disintegrating tablet 5 mg  5 mg Oral TID PRN Massengill, Harrold Donath, MD       ondansetron (ZOFRAN-ODT) disintegrating tablet 4 mg  4 mg Oral Q6H PRN Massengill, Nathan, MD       thiamine (Vitamin B-1) tablet 100 mg  100 mg Oral Daily Massengill, Nathan, MD   100 mg at 11/08/23 1610   traZODone (DESYREL) tablet 50 mg  50 mg Oral QHS PRN Marlou Sa, NP        Lab Results:  Results for orders placed or performed  during the hospital encounter of 11/06/23 (from the past 48 hours)  Hepatitis B surface antigen     Status: None   Collection Time: 11/07/23  6:27 PM  Result Value Ref Range   Hepatitis B Surface Ag NON REACTIVE NON REACTIVE    Comment: Performed at [facility], [address], [place], [place] [address]  HCV Ab w Reflex to Quant PCR     Status: None   Collection Time: 11/07/23  6:27 PM  Result Value Ref Range   HCV Ab Non Reactive Non Reactive    Comment: (NOTE) Performed At: [facility] [address] [place], [place] [id number] Jolene Schimke MD NW:[id number]   Interpretation:     Status: None   Collection Time: 11/07/23  6:27 PM  Result Value Ref Range   HCV Interp 1: Comment     Comment: (NOTE) Not infected with HCV unless early or acute infection is suspected (which may be delayed in an immunocompromised individual), or other evidence exists to indicate HCV infection. Performed At: [facility] [address] [place], [place] [id number] Jolene Schimke MD BM:[id number]     Blood Alcohol level:  Lab Results  Component Value Date   [facility] <10 11/05/2023   ETH <5 08/24/2014    Metabolic Labs: Lab Results  Component Value Date   HGBA1C 4.4 (L) 11/05/2023   MPG 79.58 11/05/2023   No results found for: "PROLACTIN" Lab Results  Component Value Date   CHOL 189 11/05/2023   TRIG 80 11/05/2023   HDL 68 11/05/2023   CHOLHDL 2.8 11/05/2023   VLDL 16 11/05/2023   LDLCALC 105 (H) 11/05/2023    Physical Findings: AIMS: No  CIWA:  CIWA-Ar Total: 2 COWS:     Psychiatric Specialty Exam: General Appearance: Age-appropriate, groomed male sitting on bed.   Eye Contact: Makes fair eye contact and cooperative with interview.   Speech: Regular rate and rhythm.   Volume: Increased   Mood: "Pretty good"   Affect: Labile and congruent with mood.   Thought Content: Delusions and paranoia   Suicidal Thoughts: Patient denied.   Homicidal  Thoughts: Patient endorsed thoughts of hurting another patient, but denied plan, intent, or attempt.   Thought Process: Disorganized and circumferential   Orientation: Alert and oriented x4 to person, place, time, and situation     Memory: Immediate Fair; Recent Poor; Remote Poor   Judgment: Impaired   Insight: Lacking   Concentration: Poor   Recall: Poor   Fund of Knowledge: Poor   Language: Poor   Psychomotor Activity: Psychomotor Activity: Normal   Assets: Desire for Improvement; Communication Skills; Physical Health   Sleep: "Very good" for 6 hours.    Review of Systems Review of Systems  Constitutional:  Negative for chills, diaphoresis and fever.  HENT:  Negative for hearing loss and sore throat.   Eyes:  Negative for blurred vision and double  vision.  Respiratory:  Negative for cough, shortness of breath and wheezing.   Cardiovascular:  Negative for chest pain and palpitations.  Gastrointestinal:  Negative for constipation, diarrhea, nausea and vomiting.  Musculoskeletal:  Negative for myalgias.  Neurological:  Negative for dizziness and headaches.  Psychiatric/Behavioral:  Positive for hallucinations. Negative for depression and suicidal ideas. The patient is nervous/anxious. The patient does not have insomnia.     Vital Signs: Blood pressure 125/79, pulse 70, temperature 98.2 F (36.8 C), temperature source Oral, resp. rate 18, height 5\' 11"  (1.803 m), weight 72.6 kg, SpO2 100%. Body mass index is 22.32 kg/m.  Physical Exam Constitutional:      Appearance: Normal appearance.  Eyes:     Conjunctiva/sclera: Conjunctivae normal.  Pulmonary:     Effort: Pulmonary effort is normal.  Musculoskeletal:     Cervical back: Normal range of motion.  Neurological:     Mental Status: He is alert and oriented to person, place, and time.     Assets  Assets: Desire for Improvement; Communication Skills; Physical Health   Treatment Plan Summary: Daily contact  with patient to assess and evaluate symptoms and progress in treatment and Medication management  Diagnoses / Active Problems: Paranoid schizophrenia (HCC) Principal Problem:   Paranoid schizophrenia (HCC)   ASSESSMENT: Matthew Carney is a 48 y.o. male with a past psychiatric history of paranoid schizophrenia admitted voluntarily to the [facility] from [facility]) for evaluation and management of paranoia about personal safety. (Patient was admitted on 11/06/2023, total LOS: 2 days)  On assessment, Mr. Hannum reported feeling better compared to the time of his intake and was more cooperative during his interview. He continued to report interactions with the energy around him and endorsed apathy towards the individuals he thought were originally out to get him. He denied SI and AVH. He endorsed thoughts of HI toward another patient, but denied a plan, intent, or attempt. He rated his anxiety 3/10 and depression as 0/10. He reported a noticeably beneficial, mellowing effect from his Zyprexa without any side effects. He reported more information regarding his prior incarcerations documented above. He continued to report an uncertain history of past and current alcohol use. We attempted to obtain collateral from patient's mother, but received no answer. His labs for Hepatitis B and C resulted negative this morning with HIV still pending. We will continue to monitor Mr. Bradish activity, medication efficacy and compliance, and side effects to find a stable regimen that works best for him. We will continue to attempt to obtain collateral from patient's mother and reach out to social work to consult on future dispo planning.  PLAN: Safety and Monitoring:  -- Voluntary admission to inpatient psychiatric unit for safety, stabilization and treatment  -- Daily contact with patient to assess and evaluate symptoms and progress in treatment  -- Patient's case to be discussed  in multi-disciplinary team meeting  -- Observation Level : q15 minute checks  -- Vital signs:  q12 hours  -- Precautions: suicide, elopement, and assault  2. Interventions (medications, psychoeducation, etc):               -- medical regimen:  -- continue Zyprexa 10 mg twice daily for schizophrenia -- continue multivitamin with minerals 1 tablet daily -- continue thiamine 100 mg once daily   PRN medications for symptomatic management:              -- continue acetaminophen 650 mg every 6 hours as  needed for mild to moderate pain, fever, and headaches              -- continue hydroxyzine 25 mg three times a day as needed for anxiety              -- continue bismuth subsalicylate 524 mg oral chewable tablet every 3 hours as needed for indigestion              -- continue senna 8.6 mg oral at bedtime as needed and polyethylene glycol 17 g oral daily as needed for mild to moderate constipation              -- continue ondansetron 8 mg every 8 hours as needed for nausea or vomiting              -- continue aluminum-magnesium hydroxide + simethicone 30 mL every 4 hours as needed for heartburn              -- continue trazodone 50 mg at bedtime as needed for insomnia   -- Continue CIWA protocol with Ativan 1 mg as needed for CIWA > 10, possible alcohol withdrawal   -- As needed agitation protocol in-place, switching haldol to zyprexa for all PRN  The risks/benefits/side-effects/alternatives to the above medication were discussed in detail with the patient and time was given for questions. The patient consents to medication trial. FDA black box warnings, if present, were discussed.  The patient is agreeable with the medication plan, as above. We will monitor the patient's response to pharmacologic treatment, and adjust medications as necessary.  3. Routine and other pertinent labs:             -- Metabolic profile:  BMI: Body mass index is 22.32 kg/m.  Prolactin: No results found for:  "PROLACTIN"  Lipid Panel: Lab Results  Component Value Date   CHOL 189 11/05/2023   TRIG 80 11/05/2023   HDL 68 11/05/2023   CHOLHDL 2.8 11/05/2023   VLDL 16 11/05/2023   LDLCALC 105 (H) 11/05/2023    HbgA1c: Hgb A1c MFr Bld (%)  Date Value  11/05/2023 4.4 (L)    TSH: TSH (uIU/mL)  Date Value  11/05/2023 1.685  11/10/2009    0.876 (NOTE) ** Please note change in reference ranges for ages 40W to 34Y. ** **Test methodology is 3rd generation TSH**    EKG monitoring: QTc: 395.  4. Group Therapy:  -- Encouraged patient to participate in unit milieu and in scheduled group therapies   -- Short Term Goals: Ability to identify changes in lifestyle to reduce recurrence of condition, verbalize feelings, identify and develop effective coping behaviors, maintain clinical measurements within normal limits, and identify triggers associated with substance abuse/mental health issues will improve. Improvement in ability to disclose and discuss suicidal ideas, demonstrate self-control, and comply with prescribed medications.  -- Long Term Goals: Improvement in symptoms so as ready for discharge -- Patient is encouraged to participate in group therapy while admitted to the psychiatric unit. -- We will address other chronic and acute stressors, which contributed to the patient's Paranoid schizophrenia (HCC) in order to reduce the risk of self-harm at discharge.  5. Discharge Planning:   -- Social work and case management to assist with discharge planning and identification of hospital follow-up needs prior to discharge  -- Estimated LOS: 6-9 days  -- Discharge Concerns: Need to establish a safety plan; Medication compliance and effectiveness  -- Discharge Goals: Return home with outpatient referrals for mental  health follow-up including medication management/psychotherapy  I certify that inpatient services furnished can reasonably be expected to improve the patient's condition.    Signed: Janith Lima, Medical Student 11/08/2023, 9:42 AM

## 2023-11-09 DIAGNOSIS — F200 Paranoid schizophrenia: Secondary | ICD-10-CM | POA: Diagnosis not present

## 2023-11-09 NOTE — Progress Notes (Signed)
   11/09/23 0844  Psych Admission Type (Psych Patients Only)  Admission Status Involuntary  Psychosocial Assessment  Patient Complaints None  Eye Contact Fair  Facial Expression Flat  Affect Flat  Speech Logical/coherent  Interaction Assertive  Motor Activity Slow  Appearance/Hygiene Unremarkable  Behavior Characteristics Cooperative  Mood Pleasant  Thought Process  Coherency WDL  Content WDL  Delusions None reported or observed  Perception WDL  Hallucination None reported or observed  Judgment Poor  Confusion None  Danger to Self  Current suicidal ideation? Denies  Self-Injurious Behavior No self-injurious ideation or behavior indicators observed or expressed   Agreement Not to Harm Self Yes  Description of Agreement verbal  Danger to Others  Danger to Others None reported or observed

## 2023-11-09 NOTE — Progress Notes (Signed)
 [facility] Medical Student Progress Note  11/09/2023 9:04 AM JAKIM DRAPEAU  MRN:  [id number]  Principal Problem: Paranoid schizophrenia (HCC) Diagnosis: Principal Problem:   Paranoid schizophrenia (HCC)   Reason for Admission:  Matthew Carney is a 48 y.o. male with a past psychiatric history of paranoid schizophrenia admitted voluntarily to the [facility]) for evaluation and management of paranoia about personal safety. (Patient was admitted on 11/06/2023, total  LOS: 3 days )  Chart Review from last 24 hours:  The patient's chart was reviewed and nursing notes were reviewed. The patient's case was discussed in multidisciplinary team meeting.   - Overnight events to report per chart review / staff report: no notable overnight events to report - Patient received all scheduled medications - Patient received the following PRN medications: nicotine polacrilex and trazodone  Information Obtained Today During Patient Interview: The patient was seen and evaluated on the unit. On assessment today the patient reported feeling "a lot better." He described his mood as "so far so good." He stated that he finally is "seeing that you're trying to help." He reported that he spent most of the day yesterday sleeping in bed. He endorsed attending two group sessions yesterday, enjoying the group session where he learned about deep breathing techniques. He reported spending the evening meditating and calling his mother and eldest daughter. He reported sleeping well for 8 hours. Nursing reported he slept for 6.75 hours. This morning, he endorsed spending his time reading. Over the last 24 hours, he reported his anxiety and depression as both 0/10 (with 10 being the most severe). He reported that his deep breathing exercises helped him whenever he felt anxious. He denied any pain and stated that his energy levels felt more balanced. He denied any SI, HI, and AVH. He  denied wanting to hurt any patients or the people who were after him. He reported that his appetite and hydration have been well. He reported his most recent bowel movement as last night. He endorsed a continued beneficial effect from his Zyprexa, feeling "more at peace." He denied any side effects and reported no concerns about continuing his medication outside of the hospital if there were no side effects. He continued to endorse being able to communicate with the energy. He reported that he no longer feared for his safety, but did fear for the safety of his family, stating that the same people who wanted to hurt him might hurt them. He provided permission to contact his older daughter for collateral without restrictions.He reported that he had thought about his next steps after discharge, but adamantly declined to share them on interview.   Collateral information obtained Anne Fu, patient's mother, [phone]) Patient provided consent to obtain collateral from his mother without restrictions. Patient also provided consent to obtain collateral from his eldest daughter, Marlinda Mike [phone]), without restrictions.  We attempted to contact patient's mother on 11/08/23 and 11/09/23, but received no answer. We attempted to contact patient's daughter on 11/09/23, but received no answer. We will call back at a later time to obtain collateral.    Past Psychiatric History:  Previous psych diagnoses:  Patient reported history of paranoid schizophrenia, diagnosed in 2007. Patient reported history of MDD, diagnosed at unknown time Prior inpatient psychiatric treatment:  Patient reported 2 prior inpatient psychiatric treatments - 1x in Jun 2011 for suicide attempt and 1x in Jan 2016 for schizophrenia Prior outpatient psychiatric treatment: Denies Current psychiatric provider: Denies  Neuromodulation history: denies   Current therapist: Denies Psychotherapy hx: Denies   History of suicide  attempts:  Patient reported 1x history of suicide attempt in Jun 2011 via jumping off a bridge History of homicide: Denies  Past Medication Trials: Patient reported trying medications in the past, but denied knowing which medications, their efficacies, and side effects.  Past Medical History: History reviewed. No pertinent past medical history.  Family Psychiatric History:  Psych: Patient endorsed history of psychiatric conditions in his family, but denied explaining any further. Psych Rx: Patient denied knowing any medications regarding his family. Suicide: Patient endorsed one history of suicide attempt in his sister who passed. Homicide: Patient denied. Substance use family hx: Patient endorsed history of substance use in his family, but denied explaining any further.  Social History:  Place of birth and grew up where: Patient reported being born and growing up in [place], [place] Abuse: Patient described his childhood as rough, but declined to elaborate. 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 Highest level of education: 8th grade Housing:  Reported to be living with a friend until current hospitalization Finances: no reliable source of income Legal: Previously incarcerated multiple times- once for a span of 10 years for armed robbery and kidnapping, another span of 4.5 years for robbery, and most recently for 8 years for assault, kidnapping, and Programmer, systems: never served Consulting civil engineer: denies owning any firearms Pills stockpile: endorses having a stockpile of unspecified pills, but reported that he "would never take them"  Current Medications: Current Facility-Administered Medications  Medication Dose Route Frequency Provider Last Rate Last Admin   acetaminophen (TYLENOL) tablet 650 mg  650 mg Oral Q6H PRN Marlou Sa, NP       alum & mag hydroxide-simeth (MAALOX/MYLANTA) 200-200-20 MG/5ML suspension 30 mL  30 mL Oral Q4H PRN Rayburn Go, Veronique M, NP       hydrOXYzine (ATARAX) tablet 25 mg  25 mg Oral TID PRN Massengill, Harrold Donath, MD       loperamide (IMODIUM) capsule 2-4 mg  2-4 mg Oral PRN Massengill, Harrold Donath, MD       LORazepam (ATIVAN) tablet 1 mg  1 mg Oral Q6H PRN Massengill, Harrold Donath, MD       multivitamin with minerals tablet 1 tablet  1 tablet Oral Daily Massengill, Nathan, MD   1 tablet at 11/09/23 0844   nicotine polacrilex (NICORETTE) gum 2 mg  2 mg Oral PRN Phineas Inches, MD   2 mg at 11/09/23 0845   OLANZapine (ZYPREXA) injection 10 mg  10 mg Intramuscular TID PRN Massengill, Harrold Donath, MD       OLANZapine (ZYPREXA) injection 5 mg  5 mg Intramuscular TID PRN Massengill, Harrold Donath, MD       OLANZapine (ZYPREXA) tablet 10 mg  10 mg Oral Q12H Massengill, Harrold Donath, MD   10 mg at 11/09/23 0844   OLANZapine zydis (ZYPREXA) disintegrating tablet 5 mg  5 mg Oral TID PRN Phineas Inches, MD       ondansetron (ZOFRAN-ODT) disintegrating tablet 4 mg  4 mg Oral Q6H PRN Massengill, Nathan, MD       thiamine (Vitamin B-1) tablet 100 mg  100 mg Oral Daily Massengill, Nathan, MD   100 mg at 11/09/23 0844   traZODone (DESYREL) tablet 50 mg  50 mg Oral QHS PRN Marlou Sa, NP   50 mg at 11/08/23 2044  Lab Results:  Results for orders placed or performed during the hospital encounter of 11/06/23 (from the past 48 hours)  Hepatitis B surface antigen     Status: None   Collection Time: 11/07/23  6:27 PM  Result Value Ref Range   Hepatitis B Surface Ag NON REACTIVE NON REACTIVE    Comment: Performed at [facility], [address], [place], [place] [address]  HCV Ab w Reflex to Quant PCR     Status: None   Collection Time: 11/07/23  6:27 PM  Result Value Ref Range   HCV Ab Non Reactive Non Reactive    Comment: (NOTE) Performed At: [facility] [address] [place], [place]  [id number] Jolene Schimke MD [id number]   Interpretation:     Status: None   Collection Time: 11/07/23  6:27 PM  Result Value Ref Range   HCV Interp 1: Comment     Comment: (NOTE) Not infected with HCV unless early or acute infection is suspected (which may be delayed in an immunocompromised individual), or other evidence exists to indicate HCV infection. Performed At: [facility] [address] [place], [place] [id number] Jolene Schimke MD [id number]     Blood Alcohol level:  Lab Results  Component Value Date   [facility] <10 11/05/2023   ETH <5 08/24/2014    Metabolic Labs: Lab Results  Component Value Date   HGBA1C 4.4 (L) 11/05/2023   MPG 79.58 11/05/2023   No results found for: "PROLACTIN" Lab Results  Component Value Date   CHOL 189 11/05/2023   TRIG 80 11/05/2023   HDL 68 11/05/2023   CHOLHDL 2.8 11/05/2023   VLDL 16 11/05/2023   LDLCALC 105 (H) 11/05/2023    Physical Findings: AIMS: No  CIWA:  CIWA-Ar Total: 0 COWS:     Psychiatric Specialty Exam: General Appearance: Age-appropriate, groomed male sitting on bed.   Eye Contact: Makes fair eye contact and cooperative with interview.   Speech: Regular rate and rhythm.   Volume: Increased   Mood: "So far so good"   Affect: Labile and congruent with mood.   Thought Content: Delusions and paranoia   Suicidal Thoughts: Patient denied.   Homicidal Thoughts: Patient denied.   Thought Process: Disorganized and circumferential   Orientation: Alert and oriented x4 to person, place, time, and situation     Memory: Immediate Fair; Recent Poor; Remote Poor   Judgment: Impaired   Insight: Lacking   Concentration: Poor   Recall: Poor   Fund of Knowledge: Poor   Language: Poor   Psychomotor Activity: No data recorded   Assets: Desire for Improvement; Communication Skills; Physical Health   Sleep: Well for 8 hours.    Review of Systems Review of Systems  Constitutional:   Negative for chills, diaphoresis and fever.  HENT:  Negative for hearing loss and sore throat.   Eyes:  Negative for blurred vision and double vision.  Respiratory:  Negative for cough, shortness of breath and wheezing.   Cardiovascular:  Negative for chest pain and palpitations.  Gastrointestinal:  Negative for constipation, diarrhea, nausea and vomiting.  Genitourinary:  Negative for dysuria.  Musculoskeletal:  Negative for myalgias.  Neurological:  Negative for dizziness and headaches.  Psychiatric/Behavioral:  Positive for hallucinations. Negative for depression and suicidal ideas. The patient is nervous/anxious. The patient does not have insomnia.     Vital Signs: Blood pressure (!) 131/95, pulse 64, temperature 97.6 F (36.4 C), temperature source Oral, resp. rate 18, height 5\' 11"  (1.803  m), weight 72.6 kg, SpO2 100%. Body mass index is 22.32 kg/m.  Physical Exam Constitutional:      Appearance: Normal appearance.  Eyes:     Conjunctiva/sclera: Conjunctivae normal.  Pulmonary:     Effort: Pulmonary effort is normal.  Musculoskeletal:     Cervical back: Normal range of motion.  Neurological:     Mental Status: He is alert and oriented to person, place, and time.     Assets  Assets: Desire for Improvement; Communication Skills; Physical Health   Treatment Plan Summary: Daily contact with patient to assess and evaluate symptoms and progress in treatment and Medication management  Diagnoses / Active Problems: Paranoid schizophrenia (HCC) Principal Problem:   Paranoid schizophrenia (HCC)   ASSESSMENT: Matthew Carney is a 48 y.o. male with a past psychiatric history of paranoid schizophrenia admitted voluntarily to the [facility]) for evaluation and management of paranoia about personal safety. (Patient was admitted on 11/06/2023, total LOS: 3 days)  On assessment, Mr. Gillie reported feeling "a lot better" compared  to the previous day. He continued to endorse interactions with the energy around him, but denied fearing for his safety. He endorsed paranoia and fear for the safety of his family. He denied SI, HI, and AVH. He reported both his anxiety and depression as 0/10. He continued to endorse beneficial effects from his medication regimen, denying any side effects. He reported feeling comfortable continuing his medication after his hospital stay given there were no side effects. We attempted to obtain collateral from patient's mother and eldest daughter but received no answer. His labs for HIV are still pending. We will continue to monitor Mr. Welchel medication efficacy, compliance, and side effects for stability. We will continue to attempt to obtain collateral. We will reach out to social work for future dispo planning.   PLAN: Safety and Monitoring:  -- Voluntary admission to inpatient psychiatric unit for safety, stabilization and treatment  -- Daily contact with patient to assess and evaluate symptoms and progress in treatment  -- Patient's case to be discussed in multi-disciplinary team meeting  -- Observation Level : q15 minute checks  -- Vital signs:  q12 hours  -- Precautions: suicide, elopement, and assault  2. Interventions (medications, psychoeducation, etc):               -- medical regimen:  -- continue Zyprexa 10 mg twice daily for schizophrenia -- continue multivitamin with minerals 1 tablet daily -- continue thiamine 100 mg once daily   PRN medications for symptomatic management:              -- continue acetaminophen 650 mg every 6 hours as needed for mild to moderate pain, fever, and headaches              -- continue hydroxyzine 25 mg three times a day as needed for anxiety              -- continue bismuth subsalicylate 524 mg oral chewable tablet every 3 hours as needed for indigestion              -- continue senna 8.6 mg oral at bedtime as needed and polyethylene glycol 17 g oral  daily as needed for mild to moderate constipation              -- continue ondansetron 8 mg every 8 hours as needed for nausea or vomiting              --  continue aluminum-magnesium hydroxide + simethicone 30 mL every 4 hours as needed for heartburn              -- continue trazodone 50 mg at bedtime as needed for insomnia   -- Continue CIWA protocol with Ativan 1 mg as needed for CIWA > 10, possible alcohol withdrawal   -- As needed agitation protocol in-place, switching haldol to zyprexa for all PRN  The risks/benefits/side-effects/alternatives to the above medication were discussed in detail with the patient and time was given for questions. The patient consents to medication trial. FDA black box warnings, if present, were discussed.  The patient is agreeable with the medication plan, as above. We will monitor the patient's response to pharmacologic treatment, and adjust medications as necessary.  3. Routine and other pertinent labs:             -- Metabolic profile:  BMI: Body mass index is 22.32 kg/m.  Prolactin: No results found for: "PROLACTIN"  Lipid Panel: Lab Results  Component Value Date   CHOL 189 11/05/2023   TRIG 80 11/05/2023   HDL 68 11/05/2023   CHOLHDL 2.8 11/05/2023   VLDL 16 11/05/2023   LDLCALC 105 (H) 11/05/2023    HbgA1c: Hgb A1c MFr Bld (%)  Date Value  11/05/2023 4.4 (L)    TSH: TSH (uIU/mL)  Date Value  11/05/2023 1.685  11/10/2009    0.876 (NOTE) ** Please note change in reference ranges for ages 38W to 42Y. ** **Test methodology is 3rd generation TSH**    EKG monitoring: QTc: 395.  4. Group Therapy:  -- Encouraged patient to participate in unit milieu and in scheduled group therapies   -- Short Term Goals: Ability to identify changes in lifestyle to reduce recurrence of condition, verbalize feelings, identify and develop effective coping behaviors, maintain clinical measurements within normal limits, and identify triggers associated with  substance abuse/mental health issues will improve. Improvement in ability to disclose and discuss suicidal ideas, demonstrate self-control, and comply with prescribed medications.  -- Long Term Goals: Improvement in symptoms so as ready for discharge -- Patient is encouraged to participate in group therapy while admitted to the psychiatric unit. -- We will address other chronic and acute stressors, which contributed to the patient's Paranoid schizophrenia (HCC) in order to reduce the risk of self-harm at discharge.  5. Discharge Planning:   -- Social work and case management to assist with discharge planning and identification of hospital follow-up needs prior to discharge  -- Estimated LOS: 5-8 days  -- Discharge Concerns: Need to establish a safety plan; Medication compliance and effectiveness  -- Discharge Goals: Return home with outpatient referrals for mental health follow-up including medication management/psychotherapy  I certify that inpatient services furnished can reasonably be expected to improve the patient's condition.   Signed: Janith Lima, Medical Student 11/09/2023, 9:04 AM

## 2023-11-09 NOTE — [facility] Group Notes (Signed)
 Spirituality Group Focus of discussion: Gratitude and Strength Awareness  Process: Following theoretical framework of group therapy of Chyrl Civatte and further informed by Rogerian and Relational Cultural Theory approaches, participants invited to name:  Sources of gratitude (internal>external) Articulate gratitude for self Name a personal strength/gift/skill Locate points of resonance among group members/engage the "here and now" Conclude with grounding/breathwork  Observations: Matthew Carney was an active participant in the group discussion. He made many thoughtful observations and helped peers to engage and expand the conversation.  Matthew Carney, M.Div [phone]

## 2023-11-09 NOTE — [facility] Group Notes (Deleted)
 Adult Psychoeducational Group Note  Date:  11/09/2023 Time:  9:45 PM  Group Topic/Focus:  Wrap-Up Group:   The focus of this group is to help patients review their daily goal of treatment and discuss progress on daily workbooks.  Participation Level:  Did Not Attend  Christ Kick 11/09/2023, 9:45 PM

## 2023-11-10 DIAGNOSIS — F200 Paranoid schizophrenia: Secondary | ICD-10-CM | POA: Diagnosis not present

## 2023-11-10 NOTE — Group Note (Signed)
 Date:  11/10/2023 Time:  8:52 PM  Group Topic/Focus:  Wrap-Up Group:   The focus of this group is to help patients review their daily goal of treatment and discuss progress on daily workbooks.    Participation Level:  Active  Participation Quality:  Appropriate  Affect:  Appropriate  Cognitive:  Appropriate  Insight: Appropriate  Engagement in Group:  Engaged  Modes of Intervention:  Education and Exploration  Additional Comments:  Patient attended and participated in group tonight. He reports the best part of his day was when he realize today that not every one is against him,  Scot Dock 11/10/2023, 8:52 PM

## 2023-11-10 NOTE — [facility] Group Notes (Signed)
 Adult Psychoeducational Group Note  Date:  11/10/2023 Time:  6:52 PM  Group Topic/Focus:  Goals Group:   The focus of this group is to help patients establish daily goals to achieve during treatment and discuss how the patient can incorporate goal setting into their daily lives to aide in recovery. Orientation:   The focus of this group is to educate the patient on the purpose and policies of crisis stabilization and provide a format to answer questions about their admission.  The group details unit policies and expectations of patients while admitted.  Participation Level:  Did Not Attend  Participation Quality:    Affect:    Cognitive:    Insight:   Engagement in Group:    Modes of Intervention:    Additional Comments:    Sheran Lawless 11/10/2023, 6:52 PM

## 2023-11-10 NOTE — Progress Notes (Signed)
 [facility] MD Progress Note  11/10/2023 12:45 PM TYSE AURIEMMA  MRN:  [id number]  Subjective:    ANOOP Carney is a 48 y.o. male with a past psychiatric history of paranoid schizophrenia admitted voluntarily to the [facility] from [facility]) for evaluation and management of paranoia about personal safety.   On assessment today, the patient is more calm and pleasant.  He is much less irritable.  He states he thinks the medications are working well for him to help stabilize his mood.  He denies paranoia and states that he is no longer worried about people outside the hospital or inside the hospital.  Does not believe that his life is in danger outside the hospital.  Denies any AH or VH.  Reports sleep and appetite are okay.  Concentration is better.  Denies any SI or HI.  Denies any side effects to his current psychiatric medications.  He asked for me to call his probation officer, Officer Ozmond [phone] to let him know that he is in the hospital.    Principal Problem: Paranoid schizophrenia (HCC) Diagnosis: Principal Problem:   Paranoid schizophrenia (HCC)  Total Time spent with patient: 15 minutes  Past Psychiatric History: See H&P  Past Medical History: History reviewed. No pertinent past medical history. History reviewed. No pertinent surgical history. Family History: History reviewed. No pertinent family history. Family Psychiatric  History: See H&P Social History:  Social History   Substance and Sexual Activity  Alcohol Use Yes     Social History   Substance and Sexual Activity  Drug Use No    Social History   Socioeconomic History   Marital status: Single    Spouse name: Not on file   Number of children: Not on file   Years of education: Not on file   Highest education level: Not on file  Occupational History   Not on file  Tobacco Use   Smoking status: Every Day   Smokeless tobacco: Not on file  Substance and Sexual Activity    Alcohol use: Yes   Drug use: No   Sexual activity: Not Currently  Other Topics Concern   Not on file  Social History Narrative   Not on file   Social Drivers of Health   Financial Resource Strain: Not on file  Food Insecurity: Patient Declined (11/06/2023)   Hunger Vital Sign    Worried About Running Out of Food in the Last Year: Patient declined    Ran Out of Food in the Last Year: Patient declined  Transportation Needs: Patient Declined (11/06/2023)   PRAPARE - Administrator, Civil Service (Medical): Patient declined    Lack of Transportation (Non-Medical): Patient declined  Physical Activity: Not on file  Stress: Not on file  Social Connections: Not on file   Additional Social History:                           Current Medications: Current Facility-Administered Medications  Medication Dose Route Frequency Provider Last Rate Last Admin   acetaminophen (TYLENOL) tablet 650 mg  650 mg Oral Q6H PRN Marlou Sa, NP       alum & mag hydroxide-simeth (MAALOX/MYLANTA) 200-200-20 MG/5ML suspension 30 mL  30 mL Oral Q4H PRN Rayburn Go, Veronique M, NP       hydrOXYzine (ATARAX) tablet 25 mg  25 mg Oral TID PRN Phineas Inches, MD       loperamide (  IMODIUM) capsule 2-4 mg  2-4 mg Oral PRN Eltha Tingley, Harrold Donath, MD       LORazepam (ATIVAN) tablet 1 mg  1 mg Oral Q6H PRN Margarethe Virgen, Harrold Donath, MD       multivitamin with minerals tablet 1 tablet  1 tablet Oral Daily Meia Emley, MD   1 tablet at 11/10/23 8657   nicotine polacrilex (NICORETTE) gum 2 mg  2 mg Oral PRN Phineas Inches, MD   2 mg at 11/09/23 2049   OLANZapine (ZYPREXA) injection 10 mg  10 mg Intramuscular TID PRN Telicia Hodgkiss, Harrold Donath, MD       OLANZapine (ZYPREXA) injection 5 mg  5 mg Intramuscular TID PRN Phineas Inches, MD       OLANZapine (ZYPREXA) tablet 10 mg  10 mg Oral Q12H Lakia Gritton, Harrold Donath, MD   10 mg at 11/10/23 0749   OLANZapine zydis (ZYPREXA) disintegrating tablet 5 mg   5 mg Oral TID PRN Phineas Inches, MD       ondansetron (ZOFRAN-ODT) disintegrating tablet 4 mg  4 mg Oral Q6H PRN Zykee Avakian, Harrold Donath, MD       thiamine (Vitamin B-1) tablet 100 mg  100 mg Oral Daily Kraig Genis, MD   100 mg at 11/10/23 0749   traZODone (DESYREL) tablet 50 mg  50 mg Oral QHS PRN Marlou Sa, NP   50 mg at 11/09/23 2050    Lab Results: No results found for this or any previous visit (from the past 48 hours).  Blood Alcohol level:  Lab Results  Component Value Date   ETH <10 11/05/2023   ETH <5 08/24/2014    Metabolic Disorder Labs: Lab Results  Component Value Date   HGBA1C 4.4 (L) 11/05/2023   MPG 79.58 11/05/2023   No results found for: "PROLACTIN" Lab Results  Component Value Date   CHOL 189 11/05/2023   TRIG 80 11/05/2023   HDL 68 11/05/2023   CHOLHDL 2.8 11/05/2023   VLDL 16 11/05/2023   LDLCALC 105 (H) 11/05/2023    Physical Findings: AIMS:  , ,  ,  ,    CIWA:  CIWA-Ar Total: 0 COWS:     Musculoskeletal: Strength & Muscle Tone: within normal limits Gait & Station: normal Patient leans: N/A  Psychiatric Specialty Exam:  Presentation  General Appearance:  Casual; Appropriate for Environment  Eye Contact: Good  Speech: Normal Rate  Speech Volume: Normal  Handedness: Right   Mood and Affect  Mood: Anxious  Affect: Appropriate; Congruent; Full Range   Thought Process  Thought Processes: Linear  Descriptions of Associations:Intact  Orientation:Full (Time, Place and Person)  Thought Content:Logical  History of Schizophrenia/Schizoaffective disorder:No  Duration of Psychotic Symptoms:Less than six months  Hallucinations:Hallucinations: None  Ideas of Reference:None  Suicidal Thoughts:Suicidal Thoughts: No  Homicidal Thoughts:Homicidal Thoughts: No   Sensorium  Memory: Immediate Good; Recent Good; Remote Good  Judgment: Fair  Insight: Fair   Art therapist   Concentration: Fair  Attention Span: Fair  Recall: Good  Fund of Knowledge: Good  Language: Good   Psychomotor Activity  Psychomotor Activity:Psychomotor Activity: Normal   Assets  Assets: Desire for Improvement; Communication Skills; Physical Health   Sleep  Sleep:Sleep: Fair    Physical Exam: Physical Exam Vitals reviewed.  Constitutional:      General: He is not in acute distress.    Appearance: He is normal weight. He is not toxic-appearing.  Pulmonary:     Effort: Pulmonary effort is normal. No respiratory distress.  Neurological:     Mental  Status: He is alert.     Motor: No weakness.     Gait: Gait normal.  Psychiatric:        Behavior: Behavior normal.        Judgment: Judgment normal.    Review of Systems  Constitutional:  Negative for chills and fever.  Cardiovascular:  Negative for chest pain and palpitations.  Neurological:  Negative for dizziness, tingling, tremors and headaches.  Psychiatric/Behavioral:  Positive for substance abuse. Negative for depression, hallucinations, memory loss and suicidal ideas. The patient is not nervous/anxious and does not have insomnia.   All other systems reviewed and are negative.  Blood pressure 120/89, pulse (!) 55, temperature 98 F (36.7 C), temperature source Oral, resp. rate 18, height 5\' 11"  (1.803 m), weight 72.6 kg, SpO2 100%. Body mass index is 22.32 kg/m.   Treatment Plan Summary: Daily contact with patient to assess and evaluate symptoms and progress in treatment and Medication management      ASSESSMENT: Paranoid schizophrenia     PLAN: Safety and Monitoring:             -- Involuntary admission to inpatient psychiatric unit for safety, stabilization and treatment             -- Daily contact with patient to assess and evaluate symptoms and progress in treatment             -- Patient's case to be discussed in multi-disciplinary team meeting             -- Observation Level : q15  minute checks             -- Vital signs:  q12 hours             -- Precautions: suicide, elopement, and assault   2. Interventions (medications, psychoeducation, etc):               -- medical regimen:  -- continue Zyprexa 10 mg twice daily for schizophrenia -- continue multivitamin with minerals 1 tablet daily -- continue thiamine 100 mg once daily              The patient is agreeable with the medication plan, as above. We will monitor the patient's response to pharmacologic treatment, and adjust medications as necessary.   3. Routine and other pertinent labs:             -- Metabolic profile:   BMI: Body mass index is 22.32 kg/m.   Prolactin: Recent Labs  No results found for: "PROLACTIN"     Lipid Panel: Recent Labs       Lab Results  Component Value Date    CHOL 189 11/05/2023    TRIG 80 11/05/2023    HDL 68 11/05/2023    CHOLHDL 2.8 11/05/2023    VLDL 16 11/05/2023    LDLCALC 105 (H) 11/05/2023        HbgA1c: Last Labs     Hgb A1c MFr Bld (%)  Date Value  11/05/2023 4.4 (L)        TSH:     TSH (uIU/mL)  Date Value  11/05/2023 1.685  11/10/2009      0.876 (NOTE) ** Please note change in reference ranges for ages 58W to 48Y. ** **Test methodology is 3rd generation TSH**      EKG monitoring: QTc: 395.   4. Group Therapy:             -- Encouraged patient to participate in unit  milieu and in scheduled group therapies              -- Short Term Goals: Ability to identify changes in lifestyle to reduce recurrence of condition, verbalize feelings, identify and develop effective coping behaviors, maintain clinical measurements within normal limits, and identify triggers associated with substance abuse/mental health issues will improve. Improvement in ability to disclose and discuss suicidal ideas, demonstrate self-control, and comply with prescribed medications.             -- Long Term Goals: Improvement in symptoms so as ready for discharge -- Patient is  encouraged to participate in group therapy while admitted to the psychiatric unit. -- We will address other chronic and acute stressors, which contributed to the patient's Paranoid schizophrenia (HCC) in order to reduce the risk of self-harm at discharge.   5. Discharge Planning:              -- Social work and case management to assist with discharge planning and identification of hospital follow-up needs prior to discharge             -- Estimated LOS: 3-4 more days              -- Discharge Concerns: Need to establish a safety plan; Medication compliance and effectiveness             -- Discharge Goals: Return home with outpatient referrals for mental health follow-up including medication management/psychotherapy   I certify that inpatient services furnished can reasonably be expected to improve the patient's condition.    Phineas Inches, MD 11/10/2023, 12:45 PM  Total Time Spent in Direct Patient Care:  I personally spent 25 minutes on the unit in direct patient care. The direct patient care time included face-to-face time with the patient, reviewing the patient's chart, communicating with other professionals, and coordinating care. Greater than 50% of this time was spent in counseling or coordinating care with the patient regarding goals of hospitalization, psycho-education, and discharge planning needs.   Phineas Inches, MD Psychiatrist

## 2023-11-10 NOTE — Plan of Care (Signed)
   Problem: Education: Goal: Emotional status will improve Outcome: Progressing Goal: Mental status will improve Outcome: Progressing

## 2023-11-10 NOTE — Progress Notes (Signed)
   11/09/23 2050  Psych Admission Type (Psych Patients Only)  Admission Status Involuntary  Psychosocial Assessment  Patient Complaints None  Eye Contact Fair  Facial Expression Flat  Affect Flat  Speech Logical/coherent  Interaction Assertive  Motor Activity Slow  Appearance/Hygiene Unremarkable  Behavior Characteristics Cooperative  Mood Pleasant  Thought Process  Coherency WDL  Content WDL  Delusions None reported or observed  Perception WDL  Hallucination None reported or observed  Judgment Poor  Confusion None  Danger to Self  Current suicidal ideation? Denies

## 2023-11-10 NOTE — Progress Notes (Signed)
   11/10/23 0904  Psych Admission Type (Psych Patients Only)  Admission Status Involuntary  Psychosocial Assessment  Patient Complaints None  Eye Contact Fair  Facial Expression Flat  Affect Flat  Speech Logical/coherent  Interaction Assertive  Motor Activity Slow  Appearance/Hygiene Unremarkable  Behavior Characteristics Cooperative  Mood Pleasant  Thought Process  Coherency WDL  Content WDL  Delusions None reported or observed  Perception WDL  Hallucination None reported or observed  Judgment Poor  Confusion None  Danger to Self  Current suicidal ideation? Denies  Self-Injurious Behavior No self-injurious ideation or behavior indicators observed or expressed   Agreement Not to Harm Self Yes  Description of Agreement verbal  Danger to Others  Danger to Others None reported or observed

## 2023-11-11 DIAGNOSIS — F200 Paranoid schizophrenia: Secondary | ICD-10-CM | POA: Diagnosis not present

## 2023-11-11 NOTE — Progress Notes (Signed)
   11/11/23 0900  Psych Admission Type (Psych Patients Only)  Admission Status Involuntary  Psychosocial Assessment  Patient Complaints None  Eye Contact Fair  Facial Expression Flat  Affect Flat  Speech Logical/coherent  Interaction Assertive  Motor Activity Other (Comment) (WDL)  Appearance/Hygiene Unremarkable  Behavior Characteristics Cooperative  Mood Pleasant  Thought Process  Coherency WDL  Content WDL  Delusions None reported or observed  Perception WDL  Hallucination None reported or observed  Judgment Poor  Confusion None  Danger to Self  Current suicidal ideation? Denies  Danger to Others  Danger to Others None reported or observed

## 2023-11-11 NOTE — Plan of Care (Signed)
  Problem: Education: Goal: Emotional status will improve Outcome: Progressing Goal: Mental status will improve Outcome: Progressing   Problem: Activity: Goal: Interest or engagement in activities will improve Outcome: Progressing   Problem: Coping: Goal: Ability to verbalize frustrations and anger appropriately will improve Outcome: Progressing Goal: Ability to demonstrate self-control will improve Outcome: Progressing   

## 2023-11-11 NOTE — Progress Notes (Signed)
   11/11/23 0557  15 Minute Checks  Location Bedroom  Visual Appearance Calm  Behavior Composed  Sleep (Behavioral Health Patients Only)  Calculate sleep? (Click Yes once per 24 hr at 0600 safety check) Yes  Documented sleep last 24 hours 6

## 2023-11-11 NOTE — Progress Notes (Signed)
 [facility] MD Progress Note  11/11/2023 3:52 PM Matthew Carney  MRN:  [id number]  Subjective:  Matthew Carney is a 48 y.o. male with a past psychiatric history of paranoid schizophrenia admitted voluntarily to the [facility] from [facility]) for evaluation and management of paranoia about personal safety.   Yesterday the psychiatry team made the following recommendations: -- continue Zyprexa 10 mg twice daily for schizophrenia -- continue multivitamin with minerals 1 tablet daily -- continue thiamine 100 mg once daily           Today's assessment notes: On assessment today, the pt reports that his mood is not depressed.  Reports depression as number is 0/10, with 10 being high severely.  Reports history of paranoia by stating he was trying to get away from somebody that he thought would harm him. However, added that these thought is resolving.  Speech is clear, coherent with normal volume and pattern.  Chart reviewed and findings shared with the treatment team and consult with attending psychiatrist.  Patient remains calm and pleasant during this assessment. Reports that anxiety is at manageable level Sleep is improving, nursing staff report patient sleeping 6 hours over the night.   Appetite is good Concentration is Improving  Energy level is adequate Denies suicidal thoughts.  Denies suicidal intent and plan.  Denies having any HI.  Denies having psychotic symptoms.   Denies having side effects to current psychiatric medications.   We discussed compliance to current medication regimen.  He asked for me to call his probation officer, Officer Ozmond [phone] to let him know that he is in the hospital.  Principal Problem: Paranoid schizophrenia (HCC) Diagnosis: Principal Problem:   Paranoid schizophrenia (HCC)  Total Time spent with patient: 15 minutes  Past Psychiatric History: See H&P  Past Medical History: History reviewed. No pertinent past  medical history. History reviewed. No pertinent surgical history. Family History: History reviewed. No pertinent family history. Family Psychiatric  History: See H&P Social History:  Social History   Substance and Sexual Activity  Alcohol Use Yes     Social History   Substance and Sexual Activity  Drug Use No    Social History   Socioeconomic History   Marital status: Single    Spouse name: Not on file   Number of children: Not on file   Years of education: Not on file   Highest education level: Not on file  Occupational History   Not on file  Tobacco Use   Smoking status: Every Day   Smokeless tobacco: Not on file  Substance and Sexual Activity   Alcohol use: Yes   Drug use: No   Sexual activity: Not Currently  Other Topics Concern   Not on file  Social History Narrative   Not on file   Social Drivers of Health   Financial Resource Strain: Not on file  Food Insecurity: Patient Declined (11/06/2023)   Hunger Vital Sign    Worried About Running Out of Food in the Last Year: Patient declined    Ran Out of Food in the Last Year: Patient declined  Transportation Needs: Patient Declined (11/06/2023)   PRAPARE - Administrator, Civil Service (Medical): Patient declined    Lack of Transportation (Non-Medical): Patient declined  Physical Activity: Not on file  Stress: Not on file  Social Connections: Not on file   Additional Social History:    Current Medications: Current Facility-Administered Medications  Medication Dose Route Frequency  Provider Last Rate Last Admin   acetaminophen (TYLENOL) tablet 650 mg  650 mg Oral Q6H PRN Marlou Sa, NP       alum & mag hydroxide-simeth (MAALOX/MYLANTA) 200-200-20 MG/5ML suspension 30 mL  30 mL Oral Q4H PRN Rayburn Go, Veronique M, NP       hydrOXYzine (ATARAX) tablet 25 mg  25 mg Oral TID PRN Phineas Inches, MD       multivitamin with minerals tablet 1 tablet  1 tablet Oral Daily Massengill, Nathan, MD    1 tablet at 11/11/23 1914   nicotine polacrilex (NICORETTE) gum 2 mg  2 mg Oral PRN Phineas Inches, MD   2 mg at 11/11/23 1247   OLANZapine (ZYPREXA) injection 10 mg  10 mg Intramuscular TID PRN Massengill, Harrold Donath, MD       OLANZapine (ZYPREXA) injection 5 mg  5 mg Intramuscular TID PRN Phineas Inches, MD       OLANZapine (ZYPREXA) tablet 10 mg  10 mg Oral Q12H Massengill, Harrold Donath, MD   10 mg at 11/11/23 0815   OLANZapine zydis (ZYPREXA) disintegrating tablet 5 mg  5 mg Oral TID PRN Phineas Inches, MD       thiamine (Vitamin B-1) tablet 100 mg  100 mg Oral Daily Massengill, Nathan, MD   100 mg at 11/11/23 0815   traZODone (DESYREL) tablet 50 mg  50 mg Oral QHS PRN Marlou Sa, NP   50 mg at 11/10/23 2027    Lab Results: No results found for this or any previous visit (from the past 48 hours).  Blood Alcohol level:  Lab Results  Component Value Date   ETH <10 11/05/2023   ETH <5 08/24/2014   Metabolic Disorder Labs: Lab Results  Component Value Date   HGBA1C 4.4 (L) 11/05/2023   MPG 79.58 11/05/2023   No results found for: "PROLACTIN" Lab Results  Component Value Date   CHOL 189 11/05/2023   TRIG 80 11/05/2023   HDL 68 11/05/2023   CHOLHDL 2.8 11/05/2023   VLDL 16 11/05/2023   LDLCALC 105 (H) 11/05/2023   Physical Findings: AIMS:  , ,  ,  ,    CIWA:  CIWA-Ar Total: 0 COWS:     Musculoskeletal: Strength & Muscle Tone: within normal limits Gait & Station: normal Patient leans: N/A  Psychiatric Specialty Exam:  Presentation  General Appearance:  Casual  Eye Contact: Good  Speech: Clear and Coherent  Speech Volume: Normal  Handedness: Right   Mood and Affect  Mood: Euthymic  Affect: Appropriate; Congruent   Thought Process  Thought Processes: Linear  Descriptions of Associations:Intact  Orientation:Full (Time, Place and Person)  Thought Content:Logical  History of Schizophrenia/Schizoaffective disorder:Yes  Duration of  Psychotic Symptoms:Less than six months  Hallucinations:Hallucinations: None  Ideas of Reference:None  Suicidal Thoughts:Suicidal Thoughts: No  Homicidal Thoughts:Homicidal Thoughts: No  Sensorium  Memory: Immediate Good; Recent Good  Judgment: Fair  Insight: Fair  Art therapist  Concentration: Fair  Attention Span: Fair  Recall: Fair  Fund of Knowledge: Fair  Language: Good  Psychomotor Activity  Psychomotor Activity:Psychomotor Activity: Normal  Assets  Assets: Communication Skills; Desire for Improvement; Physical Health; Resilience  Sleep  Sleep:Sleep: Good Number of Hours of Sleep: 6  Physical Exam: Physical Exam Vitals reviewed.  Constitutional:      General: He is not in acute distress.    Appearance: He is normal weight. He is not toxic-appearing.  HENT:     Head: Normocephalic.     Right Ear:  External ear normal.     Left Ear: External ear normal.     Nose: Nose normal.     Mouth/Throat:     Mouth: Mucous membranes are moist.     Pharynx: Oropharynx is clear.  Eyes:     Extraocular Movements: Extraocular movements intact.  Cardiovascular:     Rate and Rhythm: Bradycardia present.     Comments: BP 117/84, P 53.  Patient remains asymptomatic.  Nursing staff to recheck vital signs Pulmonary:     Effort: Pulmonary effort is normal. No respiratory distress.  Abdominal:     Comments: Deferred  Genitourinary:    Comments: Deferred Musculoskeletal:        General: Normal range of motion.     Cervical back: Normal range of motion.  Skin:    General: Skin is warm.  Neurological:     General: No focal deficit present.     Mental Status: He is alert and oriented to person, place, and time.     Motor: No weakness.     Gait: Gait normal.  Psychiatric:        Mood and Affect: Mood normal.        Behavior: Behavior normal.    Review of Systems  Constitutional:  Negative for chills and fever.  HENT:  Negative for sore throat.    Eyes:  Negative for blurred vision.  Respiratory:  Negative for cough, sputum production, shortness of breath and wheezing.   Cardiovascular:  Negative for chest pain and palpitations.  Gastrointestinal:  Negative for heartburn and nausea.  Genitourinary:  Negative for dysuria, frequency and urgency.  Musculoskeletal:  Negative for myalgias.  Skin:  Negative for itching and rash.  Neurological:  Negative for dizziness, tingling, tremors and headaches.  Endo/Heme/Allergies:        See allergy listing  Psychiatric/Behavioral:  Positive for substance abuse. Negative for depression, hallucinations, memory loss and suicidal ideas. The patient is not nervous/anxious and does not have insomnia.   All other systems reviewed and are negative.  Blood pressure 117/84, pulse (!) 53, temperature (!) 97.5 F (36.4 C), temperature source Oral, resp. rate 18, height 5\' 11"  (1.803 m), weight 72.6 kg, SpO2 100%. Body mass index is 22.32 kg/m.  Treatment Plan Summary: Daily contact with patient to assess and evaluate symptoms and progress in treatment and Medication management  ASSESSMENT: Paranoid schizophrenia     PLAN: Safety and Monitoring:             -- Involuntary admission to inpatient psychiatric unit for safety, stabilization and treatment             -- Daily contact with patient to assess and evaluate symptoms and progress in treatment             -- Patient's case to be discussed in multi-disciplinary team meeting             -- Observation Level : q15 minute checks             -- Vital signs:  q12 hours             -- Precautions: suicide, elopement, and assault   2. Interventions (medications, psychoeducation, etc):   -- medical  -- continue Zyprexa 10 mg twice daily for schizophrenia -- continue multivitamin with minerals 1 tablet daily -- continue thiamine 100 mg once daily            The patient is agreeable with the medication plan, as above.  We will monitor the patient's  response to pharmacologic treatment, and adjust medications as necessary.   3. Routine and other pertinent labs:             -- Metabolic profile:   BMI: Body mass index is 22.32 kg/m.   Prolactin: Recent Labs  No results found for: "PROLACTIN"     Lipid Panel: Recent Labs       Lab Results  Component Value Date    CHOL 189 11/05/2023    TRIG 80 11/05/2023    HDL 68 11/05/2023    CHOLHDL 2.8 11/05/2023    VLDL 16 11/05/2023    LDLCALC 105 (H) 11/05/2023        HbgA1c: Last Labs     Hgb A1c MFr Bld (%)  Date Value  11/05/2023 4.4 (L)        TSH:     TSH (uIU/mL)  Date Value  11/05/2023 1.685  11/10/2009      0.876 (NOTE) ** Please note change in reference ranges for ages 69W to 58Y. ** **Test methodology is 3rd generation TSH**      EKG monitoring: QTc: 395.   4. Group Therapy:             -- Encouraged patient to participate in unit milieu and in scheduled group therapies              -- Short Term Goals: Ability to identify changes in lifestyle to reduce recurrence of condition, verbalize feelings, identify and develop effective coping behaviors, maintain clinical measurements within normal limits, and identify triggers associated with substance abuse/mental health issues will improve. Improvement in ability to disclose and discuss suicidal ideas, demonstrate self-control, and comply with prescribed medications.             -- Long Term Goals: Improvement in symptoms so as ready for discharge -- Patient is encouraged to participate in group therapy while admitted to the psychiatric unit. -- We will address other chronic and acute stressors, which contributed to the patient's Paranoid schizophrenia (HCC) in order to reduce the risk of self-harm at discharge.   5. Discharge Planning:              -- Social work and case management to assist with discharge planning and identification of hospital follow-up needs prior to discharge             -- Estimated LOS:  3-4 more days              -- Discharge Concerns: Need to establish a safety plan; Medication compliance and effectiveness             -- Discharge Goals: Return home with outpatient referrals for mental health follow-up including medication management/psychotherapy   I certify that inpatient services furnished can reasonably be expected to improve the patient's condition.   Cecilie Lowers, FNP 11/11/2023, 3:52 PM  Patient ID: Matthew Carney, male   DOB: 1976-05-31, 48 y.o.   MRN: [id number]

## 2023-11-11 NOTE — Plan of Care (Signed)
°  Problem: Education: Goal: Emotional status will improve Outcome: Progressing Goal: Mental status will improve Outcome: Progressing   Problem: Activity: Goal: Interest or engagement in activities will improve Outcome: Progressing   Problem: Coping: Goal: Ability to verbalize frustrations and anger appropriately will improve Outcome: Progressing   Problem: Safety: Goal: Periods of time without injury will increase Outcome: Progressing

## 2023-11-11 NOTE — Group Note (Signed)
 Date:  11/11/2023 Time:  8:50 PM  Group Topic/Focus:  Wrap-Up Group:   The focus of this group is to help patients review their daily goal of treatment and discuss progress on daily workbooks.    Participation Level:  Active  Participation Quality:  Appropriate  Affect:  Appropriate  Cognitive:  Appropriate  Insight: Appropriate  Engagement in Group:  Engaged  Modes of Intervention:  Discussion  Additional Comments:   Pt states he had a "good" day and was able to participate in programming and activities. Pt had no outright concerns about his care and feels informed. Pt denied everything   Vevelyn Pat 11/11/2023, 8:50 PM

## 2023-11-12 ENCOUNTER — Encounter (HOSPITAL_COMMUNITY): Payer: Self-pay

## 2023-11-12 DIAGNOSIS — F200 Paranoid schizophrenia: Secondary | ICD-10-CM | POA: Diagnosis not present

## 2023-11-12 NOTE — BH IP Treatment Plan (Signed)
 Interdisciplinary Treatment and Diagnostic Plan Update  11/12/2023 Time of Session: 1200 - UPDATE Matthew Carney MRN: [id number]  Principal Diagnosis: Paranoid schizophrenia [facility])  Secondary Diagnoses: Principal Problem:   Paranoid schizophrenia (HCC)   Current Medications:  Current Facility-Administered Medications  Medication Dose Route Frequency Provider Last Rate Last Admin   acetaminophen (TYLENOL) tablet 650 mg  650 mg Oral Q6H PRN Marlou Sa, NP       alum & mag hydroxide-simeth (MAALOX/MYLANTA) 200-200-20 MG/5ML suspension 30 mL  30 mL Oral Q4H PRN Rayburn Go, Veronique M, NP       hydrOXYzine (ATARAX) tablet 25 mg  25 mg Oral TID PRN Phineas Inches, MD   25 mg at 11/11/23 2045   multivitamin with minerals tablet 1 tablet  1 tablet Oral Daily Massengill, Harrold Donath, MD   1 tablet at 11/12/23 0756   nicotine polacrilex (NICORETTE) gum 2 mg  2 mg Oral PRN Phineas Inches, MD   2 mg at 11/12/23 1308   OLANZapine (ZYPREXA) injection 10 mg  10 mg Intramuscular TID PRN Massengill, Harrold Donath, MD       OLANZapine (ZYPREXA) injection 5 mg  5 mg Intramuscular TID PRN Massengill, Harrold Donath, MD       OLANZapine (ZYPREXA) tablet 10 mg  10 mg Oral Q12H Massengill, Nathan, MD   10 mg at 11/12/23 0756   OLANZapine zydis (ZYPREXA) disintegrating tablet 5 mg  5 mg Oral TID PRN Phineas Inches, MD       thiamine (Vitamin B-1) tablet 100 mg  100 mg Oral Daily Massengill, Nathan, MD   100 mg at 11/12/23 0756   traZODone (DESYREL) tablet 50 mg  50 mg Oral QHS PRN Marlou Sa, NP   50 mg at 11/11/23 2045   PTA Medications: No medications prior to admission.    Patient Stressors: Financial difficulties   Medication change or noncompliance   Occupational concerns    Patient Strengths: Ability for insight  Average or above average intelligence  Communication skills   Treatment Modalities: Medication Management, Group therapy, Case management,  1 to 1 session with clinician,  Psychoeducation, Recreational therapy.   Physician Treatment Plan for Primary Diagnosis: Paranoid schizophrenia (HCC) Long Term Goal(s):     Short Term Goals:    Medication Management: Evaluate patient's response, side effects, and tolerance of medication regimen.  Therapeutic Interventions: 1 to 1 sessions, Unit Group sessions and Medication administration.  Evaluation of Outcomes: Progressing  Physician Treatment Plan for Secondary Diagnosis: Principal Problem:   Paranoid schizophrenia (HCC)  Long Term Goal(s):     Short Term Goals:       Medication Management: Evaluate patient's response, side effects, and tolerance of medication regimen.  Therapeutic Interventions: 1 to 1 sessions, Unit Group sessions and Medication administration.  Evaluation of Outcomes: Progressing   RN Treatment Plan for Primary Diagnosis: Paranoid schizophrenia (HCC) Long Term Goal(s): Knowledge of disease and therapeutic regimen to maintain health will improve  Short Term Goals: Ability to remain free from injury will improve, Ability to verbalize frustration and anger appropriately will improve, Ability to verbalize feelings will improve, Ability to identify and develop effective coping behaviors will improve, and Compliance with prescribed medications will improve  Medication Management: RN will administer medications as ordered by provider, will assess and evaluate patient's response and provide education to patient for prescribed medication. RN will report any adverse and/or side effects to prescribing provider.  Therapeutic Interventions: 1 on 1 counseling sessions, Psychoeducation, Medication administration, Evaluate responses to treatment, Monitor  vital signs and CBGs as ordered, Perform/monitor CIWA, COWS, AIMS and Fall Risk screenings as ordered, Perform wound care treatments as ordered.  Evaluation of Outcomes: Progressing   LCSW Treatment Plan for Primary Diagnosis: Paranoid schizophrenia  (HCC) Long Term Goal(s): Safe transition to appropriate next level of care at discharge, Engage patient in therapeutic group addressing interpersonal concerns.  Short Term Goals: Engage patient in aftercare planning with referrals and resources, Increase social support, Increase ability to appropriately verbalize feelings, Increase emotional regulation, and Facilitate acceptance of mental health diagnosis and concerns  Therapeutic Interventions: Assess for all discharge needs, 1 to 1 time with Social worker, Explore available resources and support systems, Assess for adequacy in community support network, Educate family and significant other(s) on suicide prevention, Complete Psychosocial Assessment, Interpersonal group therapy.  Evaluation of Outcomes: Progressing   Progress in Treatment: Attending groups: Yes Participating in groups: Yes Taking medication as prescribed: Yes. Toleration medication: Yes. Family/Significant other contact made: No, will contact:  probation officer Patient understands diagnosis: Yes. Discussing patient identified problems/goals with staff: Yes. Medical problems stabilized or resolved: Yes. Denies suicidal/homicidal ideation: Yes. Issues/concerns per patient self-inventory: No. Other: N/a   New problem(s) identified: No, Describe:  None   New Short Term/Long Term Goal(s): medication stabilization, elimination of SI thoughts, development of comprehensive mental wellness plan.    Patient Goals: "Get back to work"   Discharge Plan or Barriers: Patient recently admitted. CSW will continue to follow and assess for appropriate referrals and possible discharge planning.    Reason for Continuation of Hospitalization: Medication stabilization Other; describe Mood stabilization, discharge planning   Estimated Length of Stay: 5-7 DAYS  Last 3 [place] Suicide Severity Risk Score: Flowsheet Row [facility] Most recent reading at 11/06/2023  4:50 PM ED from 11/05/2023 in [facility] Most recent reading at 11/05/2023 12:38 PM ED from 11/05/2023 in [facility] Emergency Department at [facility] Most recent reading at 11/05/2023  3:11 AM  C-SSRS RISK CATEGORY No Risk No Risk No Risk       Last PHQ 2/9 Scores:    11/06/2023   10:27 AM 11/05/2023   10:17 AM  Depression screen PHQ 2/9  Decreased Interest 0 0  Down, Depressed, Hopeless 0 0  PHQ - 2 Score 0 0    Scribe for Treatment Team: Jacinta Shoe, LCSW 11/12/2023 5:12 PM

## 2023-11-12 NOTE — Progress Notes (Signed)
 Collateral contact - [place] (probation officer) [phone]  CSW called probation officer and informed him that patient is in the hospital.  He thanked CSW for this information.     Collateral contact - Tacey Ruiz (father) [phone]  Father was hesitant about allowing patient to come to his home.  He said, "It's my son," but added that there is a cycle: patient stops taking his medication, associates with the wrong crowd, and ends up in the hospital.  Father said, "I don't want to put him on the streets," but on the other hand, he said that he doesn't have the money to support him.    He also said that he speaks with him on the phone; patient is still confused but sounds better than when he first arrived at the hospital.  Father said that he may know someone on [address] in [place] [employer], who may take patient into their home, but he didn't know their phone numbers.     Read Drivers, LCSWA 11/12/2023

## 2023-11-12 NOTE — Group Note (Signed)
 LCSW Group Therapy Note   Group Date: 11/12/2023 Start Time: 1300 End Time: 1400   Participation:  patient was present.  He was respectful and listened, but minimally participated in the conversation.     Type of Therapy:  Group Therapy    Title:  Understanding Your Path to Change   Objective:  The goal is to help individuals understand the stages of change, identify where they currently are in the process, and provide actionable next steps to continue moving forward in their journey of change.   Goals: Learn about the six stages of change:  Precontemplation, Contemplation, Preparation, Action, Maintenance, and Relapse Reflect on Current Change Efforts:  Recognize which stage participants are in regarding a personal change. Plan Next Steps for Moving Forward:  Create an action plan based on their current stage of change.   Class Summary:  In this session, we explored the Stages of Change as a framework to understand the process of change.  We discussed how each stage helps individuals recognize where they are in their personal journey and used the Stages of Change Worksheet for self-reflection. Participants answered questions to better understand their current stage, challenges, and progress. We also emphasized the importance of moving forward, even if setbacks (Relapse) occur, and created actionable steps to help participants continue progressing. By the end of the session, participants gained a clearer understanding of their path to change and left with a clear plan for next steps.   Therapeutic Modalities:  Elements of CBT (cognitive restructuring, problem solving)  Element of DBT (mindfulness, distress tolerance)    Pahoua Schreiner O Winnona Wargo, LCSWA 11/12/2023  5:57 PM

## 2023-11-12 NOTE — Group Note (Signed)
 Recreation Therapy Group Note   Group Topic:Healthy Decision Making  Group Date: 11/12/2023 Start Time: 1021 End Time: 1041 Facilitators: Matthew Carney, LRT,CTRS Location: [address] Dayroom   Group Topic: Decision Making, Problem Solving, Communication  Goal Area(s) Addresses:  Patient will effectively work with peer towards shared goal.  Patient will identify factors that guided their decision making.  Patient will pro-socially communicate ideas during group session.   Intervention: Survival Scenario - pencil, paper  Activity: Patients were given a scenario that they were going to be stranded on a deserted [place] for several months before being rescued. Writer tasked them with making a list of 15 things they would choose to bring with them for "survival". The list of items was prioritized most important to least. Each patient would come up with their own list, then work together to create a new list of 15 items while in a group of 3-5 peers. LRT discussed each person's list and how it differed from others. The debrief included discussion of priorities, good decisions versus bad decisions, and how it is important to think before acting so we can make the best decision possible. LRT tied the concept of effective communication among group members to patient's support systems outside of the hospital and its benefit post discharge.  Education: Pharmacist, community, Priorities, Support System, Discharge Planning   Education Outcome: Acknowledges education/In group clarification/Needs additional education   Affect/Mood: Flat   Participation Level: Active   Participation Quality: Independent   Behavior: Appropriate   Speech/Thought Process: Focused   Insight: Good   Judgement: Good   Modes of Intervention: Group work   Patient Response to Interventions:  Engaged   Education Outcome:  In group clarification offered    Clinical Observations/Individualized Feedback: Pt was  quiet for the most part but was engaged in the activity. Pt was able to identify some supplies like bible, boat, hammer, nails, saw, axe, water, house, love, "understand myself", fishing poll and knife. Pt was attentive and appropriate throughout group session.     Plan: Continue to engage patient in RT group sessions 2-3x/week.   Matthew Carney, LRT,CTRS  11/12/2023 3:05 PM

## 2023-11-12 NOTE — Progress Notes (Signed)
 [facility] MD Progress Note  11/12/2023 1:34 PM Matthew Carney  MRN:  [id number]  Subjective:    Matthew Carney is a 48 y.o. male with a past psychiatric history of paranoid schizophrenia admitted voluntarily to the [facility]) for evaluation and management of paranoia about personal safety.   On assessment today, patient is pleasant.  Denies any psychotic symptoms and paranoia.  Denies any depression or sadness.  Reports anxiety is at a manageable level.  Reports sleep is okay.  Denies any side effects to his medications and likes that they stabilize his mood.  He reports that he plans to continue taking medication once he is discharged and request medication samples for a few days until he cannot afford his medications.  Reports that he plans to live with his father at discharge.  No EPS on exam today.  Denies any side effects to his current psychiatric medications.  Per social work, Engineer, drilling was contacted and is aware that the patient is in the hospital.    Principal Problem: Paranoid schizophrenia (HCC) Diagnosis: Principal Problem:   Paranoid schizophrenia (HCC)  Total Time spent with patient: 15 minutes  Past Psychiatric History: See H&P  Past Medical History: History reviewed. No pertinent past medical history. History reviewed. No pertinent surgical history. Family History: History reviewed. No pertinent family history. Family Psychiatric  History: See H&P Social History:  Social History   Substance and Sexual Activity  Alcohol Use Yes     Social History   Substance and Sexual Activity  Drug Use No    Social History   Socioeconomic History   Marital status: Single    Spouse name: Not on file   Number of children: Not on file   Years of education: Not on file   Highest education level: Not on file  Occupational History   Not on file  Tobacco Use   Smoking status: Every Day   Smokeless tobacco: Not on file   Substance and Sexual Activity   Alcohol use: Yes   Drug use: No   Sexual activity: Not Currently  Other Topics Concern   Not on file  Social History Narrative   Not on file   Social Drivers of Health   Financial Resource Strain: Not on file  Food Insecurity: Patient Declined (11/06/2023)   Hunger Vital Sign    Worried About Running Out of Food in the Last Year: Patient declined    Ran Out of Food in the Last Year: Patient declined  Transportation Needs: Patient Declined (11/06/2023)   PRAPARE - Administrator, Civil Service (Medical): Patient declined    Lack of Transportation (Non-Medical): Patient declined  Physical Activity: Not on file  Stress: Not on file  Social Connections: Not on file   Additional Social History:                           Current Medications: Current Facility-Administered Medications  Medication Dose Route Frequency Provider Last Rate Last Admin   acetaminophen (TYLENOL) tablet 650 mg  650 mg Oral Q6H PRN Marlou Sa, NP       alum & mag hydroxide-simeth (MAALOX/MYLANTA) 200-200-20 MG/5ML suspension 30 mL  30 mL Oral Q4H PRN Rayburn Go, Veronique M, NP       hydrOXYzine (ATARAX) tablet 25 mg  25 mg Oral TID PRN Phineas Inches, MD   25 mg at 11/11/23 2045  multivitamin with minerals tablet 1 tablet  1 tablet Oral Daily Edker Punt, MD   1 tablet at 11/12/23 0756   nicotine polacrilex (NICORETTE) gum 2 mg  2 mg Oral PRN Phineas Inches, MD   2 mg at 11/12/23 1308   OLANZapine (ZYPREXA) injection 10 mg  10 mg Intramuscular TID PRN Amberle Lyter, Harrold Donath, MD       OLANZapine (ZYPREXA) injection 5 mg  5 mg Intramuscular TID PRN Penny Arrambide, Harrold Donath, MD       OLANZapine (ZYPREXA) tablet 10 mg  10 mg Oral Q12H Elnor Renovato, Harrold Donath, MD   10 mg at 11/12/23 0756   OLANZapine zydis (ZYPREXA) disintegrating tablet 5 mg  5 mg Oral TID PRN Phineas Inches, MD       thiamine (Vitamin B-1) tablet 100 mg  100 mg Oral Daily  Tonya Wantz, MD   100 mg at 11/12/23 0756   traZODone (DESYREL) tablet 50 mg  50 mg Oral QHS PRN Marlou Sa, NP   50 mg at 11/11/23 2045    Lab Results: No results found for this or any previous visit (from the past 48 hours).  Blood Alcohol level:  Lab Results  Component Value Date   ETH <10 11/05/2023   ETH <5 08/24/2014    Metabolic Disorder Labs: Lab Results  Component Value Date   HGBA1C 4.4 (L) 11/05/2023   MPG 79.58 11/05/2023   No results found for: "PROLACTIN" Lab Results  Component Value Date   CHOL 189 11/05/2023   TRIG 80 11/05/2023   HDL 68 11/05/2023   CHOLHDL 2.8 11/05/2023   VLDL 16 11/05/2023   LDLCALC 105 (H) 11/05/2023    Physical Findings: AIMS:  , ,  ,  ,    CIWA:  CIWA-Ar Total: 0 COWS:     Musculoskeletal: Strength & Muscle Tone: within normal limits Gait & Station: normal Patient leans: N/A  Psychiatric Specialty Exam:  Presentation  General Appearance:  Appropriate for Environment; Casual; Fairly Groomed  Eye Contact: Good  Speech: Normal Rate; Clear and Coherent  Speech Volume: Normal  Handedness: Right   Mood and Affect  Mood: Euthymic; Anxious  Affect: Appropriate; Congruent; Full Range   Thought Process  Thought Processes: Linear  Descriptions of Associations:Intact  Orientation:Full (Time, Place and Person)  Thought Content:Logical  History of Schizophrenia/Schizoaffective disorder:Yes  Duration of Psychotic Symptoms:Greater than six months  Hallucinations:Hallucinations: None  Ideas of Reference:None  Suicidal Thoughts:Suicidal Thoughts: No  Homicidal Thoughts:Homicidal Thoughts: No   Sensorium  Memory: Immediate Good; Recent Good; Remote Good  Judgment: Fair  Insight: Fair   Art therapist  Concentration: Fair  Attention Span: Fair  Recall: Good  Fund of Knowledge: Good  Language: Good   Psychomotor Activity  Psychomotor Activity:Psychomotor  Activity: Normal   Assets  Assets: Communication Skills; Desire for Improvement; Physical Health; Resilience   Sleep  Sleep:Sleep: Fair Number of Hours of Sleep: 6    Physical Exam: Physical Exam Vitals reviewed.  Constitutional:      General: He is not in acute distress.    Appearance: He is normal weight. He is not toxic-appearing.  Pulmonary:     Effort: Pulmonary effort is normal. No respiratory distress.  Neurological:     Mental Status: He is alert.     Motor: No weakness.     Gait: Gait normal.  Psychiatric:        Behavior: Behavior normal.        Judgment: Judgment normal.    Review of Systems  Constitutional:  Negative for chills and fever.  Cardiovascular:  Negative for chest pain and palpitations.  Neurological:  Negative for dizziness, tingling, tremors and headaches.  Psychiatric/Behavioral:  Positive for substance abuse. Negative for depression, hallucinations, memory loss and suicidal ideas. The patient is not nervous/anxious and does not have insomnia.   All other systems reviewed and are negative.  Blood pressure 126/88, pulse (!) 56, temperature 98.2 F (36.8 C), temperature source Oral, resp. rate 16, height 5\' 11"  (1.803 m), weight 72.6 kg, SpO2 100%. Body mass index is 22.32 kg/m.   Treatment Plan Summary: Daily contact with patient to assess and evaluate symptoms and progress in treatment and Medication management      ASSESSMENT: Paranoid schizophrenia     PLAN: Safety and Monitoring:             -- Involuntary admission to inpatient psychiatric unit for safety, stabilization and treatment             -- Daily contact with patient to assess and evaluate symptoms and progress in treatment             -- Patient's case to be discussed in multi-disciplinary team meeting             -- Observation Level : q15 minute checks             -- Vital signs:  q12 hours             -- Precautions: suicide, elopement, and assault   2.  Interventions (medications, psychoeducation, etc):               -- medical regimen:  -- continue Zyprexa 10 mg twice daily for schizophrenia -- continue multivitamin with minerals 1 tablet daily -- continue thiamine 100 mg once daily              The patient is agreeable with the medication plan, as above. We will monitor the patient's response to pharmacologic treatment, and adjust medications as necessary.   3. Routine and other pertinent labs:             -- Metabolic profile:   BMI: Body mass index is 22.32 kg/m.   Prolactin: Recent Labs  No results found for: "PROLACTIN"     Lipid Panel: Recent Labs       Lab Results  Component Value Date    CHOL 189 11/05/2023    TRIG 80 11/05/2023    HDL 68 11/05/2023    CHOLHDL 2.8 11/05/2023    VLDL 16 11/05/2023    LDLCALC 105 (H) 11/05/2023        HbgA1c: Last Labs     Hgb A1c MFr Bld (%)  Date Value  11/05/2023 4.4 (L)        TSH:     TSH (uIU/mL)  Date Value  11/05/2023 1.685  11/10/2009      0.876 (NOTE) ** Please note change in reference ranges for ages 34W to 99Y. ** **Test methodology is 3rd generation TSH**      EKG monitoring: QTc: 395.   4. Group Therapy:             -- Encouraged patient to participate in unit milieu and in scheduled group therapies              -- Short Term Goals: Ability to identify changes in lifestyle to reduce recurrence of condition, verbalize feelings, identify and develop effective coping behaviors, maintain clinical measurements within normal  limits, and identify triggers associated with substance abuse/mental health issues will improve. Improvement in ability to disclose and discuss suicidal ideas, demonstrate self-control, and comply with prescribed medications.             -- Long Term Goals: Improvement in symptoms so as ready for discharge -- Patient is encouraged to participate in group therapy while admitted to the psychiatric unit. -- We will address other chronic and  acute stressors, which contributed to the patient's Paranoid schizophrenia (HCC) in order to reduce the risk of self-harm at discharge.   5. Discharge Planning:              -- Social work and case management to assist with discharge planning and identification of hospital follow-up needs prior to discharge             -- Estimated LOS: Tomorrow 4/1              -- Discharge Concerns: Need to establish a safety plan; Medication compliance and effectiveness             -- Discharge Goals: Return home with outpatient referrals for mental health follow-up including medication management/psychotherapy   I certify that inpatient services furnished can reasonably be expected to improve the patient's condition.    Phineas Inches, MD 11/12/2023, 1:34 PM  Total Time Spent in Direct Patient Care:  I personally spent 25 minutes on the unit in direct patient care. The direct patient care time included face-to-face time with the patient, reviewing the patient's chart, communicating with other professionals, and coordinating care. Greater than 50% of this time was spent in counseling or coordinating care with the patient regarding goals of hospitalization, psycho-education, and discharge planning needs.   Phineas Inches, MD Psychiatrist

## 2023-11-12 NOTE — Progress Notes (Signed)
   11/12/23 0756  Psych Admission Type (Psych Patients Only)  Admission Status Involuntary  Psychosocial Assessment  Patient Complaints None  Eye Contact Fair  Facial Expression Animated  Affect Flat  Speech Logical/coherent  Interaction Assertive  Motor Activity Other (Comment) (WDL)  Appearance/Hygiene Unremarkable  Behavior Characteristics Cooperative  Mood Pleasant  Thought Process  Coherency WDL  Content WDL  Delusions None reported or observed  Perception WDL  Hallucination None reported or observed  Judgment Poor  Confusion None  Danger to Self  Current suicidal ideation? Denies  Agreement Not to Harm Self Yes  Description of Agreement verbal  Danger to Others  Danger to Others None reported or observed

## 2023-11-12 NOTE — Plan of Care (Signed)
  Problem: Education: Goal: Knowledge of [facility] Education information/materials will improve Outcome: Progressing Goal: Emotional status will improve Outcome: Progressing Goal: Mental status will improve Outcome: Progressing  Patient is pleasant denies SI/HI/A/VH and verbally contracts for safety compliant with medications. No adverse effects noted. Q 15 minutes safety checks ongoing Patient remains safe.

## 2023-11-12 NOTE — Group Note (Signed)
 Date:  11/12/2023 Time:  9:23 PM  Group Topic/Focus:  Wrap-Up Group:   The focus of this group is to help patients review their daily goal of treatment and discuss progress on daily workbooks.    Participation Level:  Active  Participation Quality:  Appropriate  Affect:  Appropriate  Cognitive:  Appropriate  Insight: Appropriate  Engagement in Group:  Engaged  Modes of Intervention:  Education and Exploration  Additional Comments:  Patient attended and participated in group tonight. He reports that the goal for today was to be more open and lay back, He allowed himself to feel good he open up in group and feel relaxed.  Lita Mains [facility] 11/12/2023, 9:23 PM

## 2023-11-13 DIAGNOSIS — F200 Paranoid schizophrenia: Secondary | ICD-10-CM | POA: Diagnosis not present

## 2023-11-13 MED ORDER — NICOTINE POLACRILEX 2 MG MT GUM: 2.0000 mg | CHEWING_GUM | OROMUCOSAL | 0 refills | Status: AC | PRN

## 2023-11-13 MED ORDER — OLANZAPINE 10 MG PO TABS: 10.0000 mg | ORAL_TABLET | Freq: Two times a day (BID) | ORAL | 0 refills | Status: DC

## 2023-11-13 MED ORDER — HYDROXYZINE HCL 25 MG PO TABS: 25.0000 mg | ORAL_TABLET | Freq: Three times a day (TID) | ORAL | 0 refills | Status: AC | PRN

## 2023-11-13 MED ORDER — TRAZODONE HCL 50 MG PO TABS: 50.0000 mg | ORAL_TABLET | Freq: Every evening | ORAL | 0 refills | Status: AC | PRN

## 2023-11-13 NOTE — Discharge Summary (Signed)
 Physician Discharge Summary Note  Patient:  Matthew Carney is an 48 y.o., male MRN:  [id number] DOB:  [birth year] Patient phone:  [phone] (home)  Patient address:   Homeless [address] [place] [address],  Total Time spent with patient: 45 minutes  Date of Admission:  11/06/2023 Date of Discharge: 11/13/23  Reason for Admission:  Matthew Carney is a 48 y.o. male with a past psychiatric history of paranoid schizophrenia admitted voluntarily to the [facility]) for evaluation and management of paranoia about personal safety.   Principal Problem: Paranoid schizophrenia [facility]) Discharge Diagnoses: Principal Problem:   Paranoid schizophrenia (HCC)   Past Psychiatric History:  Previous psych diagnoses:  Patient reported history of paranoid schizophrenia, diagnosed in 2007. Patient reported history of MDD, diagnosed at unknown time Prior inpatient psychiatric treatment:  Patient reported 2 prior inpatient psychiatric treatments - 1x in Jun 2011 for suicide attempt and 1x in Jan 2016 for schizophrenia Prior outpatient psychiatric treatment: Denies Current psychiatric provider: Denies   Neuromodulation history: denies   Current therapist: Denies Psychotherapy hx: Denies   History of suicide attempts:  Patient reported 1x history of suicide attempt in Jun 2011 via jumping off a bridge History of homicide: Denies   Psychotropic medications: Current Patient denied taking any current medications prior to hospitalization   Past Patient endorsed taking medications in the past, but denies knowing which medications, their efficacies, and side effects  Past Medical History: History reviewed. No pertinent past medical history. History reviewed. No pertinent surgical history.  Family History: History reviewed. No pertinent family history. Family Psychiatric  History:  Psych: Patient endorsed history of psychiatric  conditions in his family, but denied explaining any further. Psych Rx: Patient denied knowing any medications regarding his family. Suicide: Patient endorsed one history of suicide attempt in his sister who passed. Homicide: Patient denied. Substance use family hx: Patient endorsed history of substance use in his family, but denied explaining any further. Social History:  Social History   Substance and Sexual Activity  Alcohol Use Yes     Social History   Substance and Sexual Activity  Drug Use No    Social History   Socioeconomic History   Marital status: Single    Spouse name: Not on file   Number of children: Not on file   Years of education: Not on file   Highest education level: Not on file  Occupational History   Not on file  Tobacco Use   Smoking status: Every Day   Smokeless tobacco: Not on file  Substance and Sexual Activity   Alcohol use: Yes   Drug use: No   Sexual activity: Not Currently  Other Topics Concern   Not on file  Social History Narrative   Not on file   Social Drivers of Health   Financial Resource Strain: Not on file  Food Insecurity: Patient Declined (11/06/2023)   Hunger Vital Sign    Worried About Running Out of Food in the Last Year: Patient declined    Ran Out of Food in the Last Year: Patient declined  Transportation Needs: Patient Declined (11/06/2023)   PRAPARE - Administrator, Civil Service (Medical): Patient declined    Lack of Transportation (Non-Medical): Patient declined  Physical Activity: Not on file  Stress: Not on file  Social Connections: Not on file   Place of birth and grew up where: Patient reported being born and growing up  in [place], [place] Abuse: Patient described his childhood as rough, but declined to elaborate. 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 Patient described drinking up to a bottle of liquor, but refused to elaborate about the size.  Hx withdrawal tremors/shakes: denies Hx alcohol related blackouts: denies Hx alcohol induced hallucinations: denies Hx alcoholic seizures: denies Hx medical hospitalization due to severe alcohol withdrawal symptoms: denies DUI: denies   --------   Tobacco: endorses smoking a pack of cigarettes for many years, unsure of how many years; current endorses smoking 2-3 Black and Mild's per day for the last year Cannabis (marijuana): endorses a history of using marijuana since the age of 17, patient reported being unsure of how much is used per week; last use was one week ago Cocaine:  endorses a past history of cocaine use ~$1000 a week; stopped using 9 years ago Methamphetamines: denies Psilocybin (mushrooms): denies Ecstasy (MDMA / molly): denies LSD (acid): denies Opiates (fentanyl / heroin): denies Benzos (Xanax, Klonopin): denies IV drug use: denies Prescribed meds abuse: denies   History of detox: denies History of rehab: denies  Hospital Course:   During the patient's hospitalization, patient had extensive initial psychiatric evaluation, and follow-up psychiatric evaluations every day.   Psychiatric diagnoses provided upon initial assessment: Schizophrenia, chronic paranoid type Alcohol and marijuana use disorder   Patient's  psychiatric medications were adjusted on admission: Patient was started on Zyprexa 5 mg twice daily for psychosis   During the hospitalization, other adjustments were made to the patient's psychiatric medication regimen: Zyprexa was titrated from 5 to 10 mg twice daily for psychosis and paranoia, hydroxyzine and trazodone were used as needed for anxiety and sleep. Patient refused to switch to risperidone or Invega and to be started on Invega LAI to help with long-term compliance but he reported plan to comply with medications after discharge. Patient's care was discussed during the interdisciplinary team meeting every day during the hospitalization.   The patient denied having side effects to prescribed psychiatric medication.   Gradually, patient started adjusting to milieu. The patient was evaluated each day by a clinical provider to ascertain response to treatment. Improvement was noted by the patient's report of decreasing symptoms, improved sleep and appetite, affect, medication tolerance, behavior, and participation in unit programming.  Patient was asked each day to complete a self inventory noting mood, mental status, pain, new symptoms, anxiety and concerns.     Symptoms were reported as significantly decreased or resolved completely by discharge.  Patient was counseled extensively regarding need to abstain completely from alcohol and marijuana after discharge, he agreed. On day of discharge, patient was evaluated on/1/25 the patient reports that their mood is stable. The patient denied having suicidal thoughts for more than 48 hours prior to discharge.  Patient denies having homicidal thoughts.  Patient denies having auditory hallucinations.  Patient denies any visual hallucinations or  other symptoms of psychosis. The patient was motivated to continue taking medication with a goal of continued improvement in mental health.    The patient reports their target psychiatric symptoms of paranoia,  auditory and visual hallucinations responded well to the psychiatric medications, and the patient reports overall benefit other psychiatric hospitalization. Supportive psychotherapy was provided to the patient. The patient also participated in regular group therapy while hospitalized. Coping skills, problem solving as well as relaxation therapies were also part of the unit programming.   Labs were reviewed with the patient, and abnormal results were discussed with the patient.   The patient is able to verbalize their individual safety plan to this provider.   Behavioral Events: None   Restraints: None   Groups: Attended and participated   Medications Changes: As above   D/C Medications: Zyprexa 10 mg twice daily for psychosis, hydroxyzine 25 mg 3 times daily as needed for anxiety and trazodone 50 mg at bedtime as needed for sleep, Nicorette 2 mg gum as needed for smoking cessation.   Sleep  Sleep:Sleep: Fair    Physical Findings: AIMS: Facial and Oral Movements Muscles of Facial Expression: None Lips and Perioral Area: None Jaw: None Tongue: None,Extremity Movements Upper (arms, wrists, hands, fingers): None Lower (legs, knees, ankles, toes): None, Trunk Movements Neck, shoulders, hips: None, Global Judgements Severity of abnormal movements overall : None Incapacitation due to abnormal movements: None Patient's awareness of abnormal movements: No Awareness, Dental Status Current problems with teeth and/or dentures?: No Does patient usually wear dentures?: No Edentia?: No  CIWA:  CIWA-Ar Total: 0 COWS:     Musculoskeletal: Strength & Muscle Tone: within normal limits Gait & Station: normal Patient leans: N/A   Psychiatric Specialty Exam:  General Appearance: appears at stated age, fairly dressed and groomed  Behavior: pleasant and cooperative  Psychomotor Activity:No psychomotor agitation or retardation noted   Eye Contact: good Speech: normal amount, tone, volume  and latency   Mood: euthymic Affect: congruent, pleasant and interactive  Thought Process: linear, goal directed, no circumstantial or tangential thought process noted, no racing thoughts or flight of ideas Descriptions of Associations: intact Thought Content: Hallucinations: denies AH, VH , does not appear responding to stimuli Delusions: No paranoia or other delusions noted Suicidal Thoughts: denies SI, intention, plan  Homicidal Thoughts: denies HI, intention, plan   Alertness/Orientation: alert and fully oriented  Insight: fair, improved Judgment: fair, improved  Memory: intact  Executive Functions  Concentration: intact  Attention Span: Fair Recall: intact Fund of Knowledge: fair   Assets  Assets: Manufacturing systems engineer; Desire for Improvement; Physical Health; Resilience   Sleep Sleep: Sleep: Fair    Physical Exam:  Physical Exam Vitals and nursing note reviewed.  Constitutional:      Appearance: Normal appearance.  HENT:     Head: Normocephalic and atraumatic.     Nose: Nose normal.  Eyes:     Extraocular Movements: Extraocular movements intact.  Pulmonary:     Effort: Pulmonary effort is normal.  Musculoskeletal:        General: Normal range of motion.     Cervical back: Normal range of motion.  Neurological:     General: No focal deficit present.     Mental Status: He is alert and oriented to person, place, and time. Mental status is at baseline.  Psychiatric:        Mood and Affect: Mood normal.        Behavior: Behavior normal.  Thought Content: Thought content normal.        Judgment: Judgment normal.    Review of Systems  All other systems reviewed and are negative.  Blood pressure 130/79, pulse 63, temperature 98.3 F (36.8 C), temperature source Oral, resp. rate 16, height 5\' 11"  (1.803 m), weight 72.6 kg, SpO2 100%. Body mass index is 22.32 kg/m.   Social History   Tobacco Use  Smoking Status Every Day  Smokeless Tobacco  Not on file   Tobacco Cessation:  A prescription for an FDA-approved tobacco cessation medication provided at discharge   Blood Alcohol level:  Lab Results  Component Value Date   [facility] <10 11/05/2023   ETH <5 08/24/2014    Metabolic Disorder Labs:  Lab Results  Component Value Date   HGBA1C 4.4 (L) 11/05/2023   MPG 79.58 11/05/2023   No results found for: "PROLACTIN" Lab Results  Component Value Date   CHOL 189 11/05/2023   TRIG 80 11/05/2023   HDL 68 11/05/2023   CHOLHDL 2.8 11/05/2023   VLDL 16 11/05/2023   LDLCALC 105 (H) 11/05/2023    See Psychiatric Specialty Exam and Suicide Risk Assessment completed by Attending Physician prior to discharge.  Discharge destination:  Other:  Homeless shelter/IRC  Is patient on multiple antipsychotic therapies at discharge:  No   Has Patient had three or more failed trials of antipsychotic monotherapy by history:  No  Recommended Plan for Multiple Antipsychotic Therapies: NA  Discharge Instructions     Diet - low sodium heart healthy   Complete by: As directed    Increase activity slowly   Complete by: As directed       Allergies as of 11/13/2023   No Known Allergies      Medication List     TAKE these medications      Indication  hydrOXYzine 25 MG tablet Commonly known as: ATARAX Take 1 tablet (25 mg total) by mouth 3 (three) times daily as needed for anxiety.  Indication: Feeling Anxious   nicotine polacrilex 2 MG gum Commonly known as: NICORETTE Take 1 each (2 mg total) by mouth as needed for smoking cessation.  Indication: Nicotine Addiction   OLANZapine 10 MG tablet Commonly known as: ZYPREXA Take 1 tablet (10 mg total) by mouth every 12 (twelve) hours.  Indication: Schizophrenia   traZODone 50 MG tablet Commonly known as: DESYREL Take 1 tablet (50 mg total) by mouth at bedtime as needed for sleep.  Indication: Trouble Sleeping        Follow-up Information     [facility]  Go on 11/15/2023.   Specialty: Professional Counselor Why: Please go to this provider for therapy services on Monday through Friday, from 9 am to 1 pm for an assessment. Contact information: [address] [place] [address] [phone]         [facility]. Go on 11/16/2023.   Specialty: Behavioral Health Why: Please go to this provider for medication management services on Monday through Friday, arrive by 7:00 am for an assessment. Contact information: [address] [place] [address] [phone]                Discharge recommendations:   Activity: as tolerated  Diet: heart healthy  # It is recommended to the patient to continue psychiatric medications as prescribed, after discharge from the hospital.     # It is recommended to the patient to follow up with your outpatient psychiatric  provider and PCP.   # It was discussed with the patient, the impact of alcohol, drugs, tobacco have been there overall psychiatric and medical wellbeing, and total abstinence from substance use was recommended the patient.ed.   # Prescriptions provided or sent directly to preferred pharmacy at discharge. Patient agreeable to plan. Given opportunity to ask questions. Appears to feel comfortable with discharge.    # In the event of worsening symptoms, the patient is instructed to call the crisis hotline, 911 and or go to the nearest ED for appropriate evaluation and treatment of symptoms. To follow-up with primary care provider for other medical issues, concerns and or health care needs   # Patient was discharged home with a plan to follow up as noted above.   Patient agrees with D/C instructions and plan.   The patient received suicide prevention pamphlet:  Yes Belongings returned:  Clothing and Valuables  Total Time Spent in Direct Patient Care:  I personally spent 45 minutes on the unit in direct patient care. The direct  patient care time included face-to-face time with the patient, reviewing the patient's chart, communicating with other professionals, and coordinating care. Greater than 50% of this time was spent in counseling or coordinating care with the patient regarding goals of hospitalization, psycho-education, and discharge planning needs.    SignedSarita Bottom, MD 11/13/2023, 9:32 AM

## 2023-11-13 NOTE — Transportation (Signed)
 11/13/2023  Matthew Carney DOB: 1975/08/17 MRN: [id number]   RIDER WAIVER AND RELEASE OF LIABILITY  For the purposes of helping with transportation needs, [facility] partners with outside transportation providers (taxi companies, [place], Catering manager.) to give [employer] patients or other approved people the choice of on-demand rides [employer]") to our buildings for non-emergency visits.  By using [employer], I, the person signing this document, on behalf of myself and/or any legal minors (in my care using the [employer]), agree:  Science writer given to me are supplied by independent, outside transportation providers who do not work for, or have any affiliation with, [employer]. [place] is not a transportation company. [facility] has no control over the quality or safety of the rides I get using [employer]. [place] has no control over whether any outside ride will happen on time or not. [facility] gives no guarantee on the reliability, quality, safety, or availability on any rides, or that no mistakes will happen. I know and accept that traveling by vehicle (car, truck, SVU, Zenaida Niece, bus, taxi, etc.) has risks of serious injuries such as disability, being paralyzed, and death. I know and agree the risk of using [employer] is mine alone, and not [employer]. Transport Services are provided "as is" and as are available. The transportation providers are in charge for all inspections and care of the vehicles used to provide these rides. I agree not to take legal action against [facility], its agents, employees, officers, directors, representatives, insurers, attorneys, assigns, successors, subsidiaries, and affiliates at any time for any reasons related directly or indirectly to using [employer]. I also agree not to take legal action against [facility] or its affiliates for any injury, death, or damage to property caused by or related to using  [employer]. I have read this Waiver and Release of Liability, and I understand the terms used in it and their legal meaning. This Waiver is freely and voluntarily given with the understanding that my right (or any legal minors) to legal action against  relating to [employer] is knowingly given up to use these services.   I attest that I read the Ride Waiver and Release of Liability to Matthew Carney, gave Mr. Brisendine the opportunity to ask questions and answered the questions asked (if any). I affirm that Matthew Carney then provided consent for assistance with transportation.     Jahlen Bollman, LCSWA 11/13/2023

## 2023-11-13 NOTE — Plan of Care (Signed)
   Problem: Education: Goal: Emotional status will improve Outcome: Progressing Goal: Mental status will improve Outcome: Progressing Goal: Verbalization of understanding the information provided will improve Outcome: Progressing

## 2023-11-13 NOTE — Progress Notes (Signed)
  [facility] Adult Case Management Discharge Plan :  Will you be returning to the same living situation after discharge:  No, previously patient was staying with his friend, and now is unhoused.  At discharge, do you have transportation home?: Yes,  CSW arranged transportation through [facility]  for 1 PM. Do you have the ability to pay for your medications: Yes,  patient has Medicaid  Release of information consent forms completed and in the chart;  Patient's signature needed at discharge.  Patient to Follow up at:  Follow-up Information     [place] Go on 11/15/2023.   Specialty: Professional Counselor Why: Please go to this provider on 11/21/2023 at 9 AM for therapy services.  If the time presented is not feasible, you can walk-in Monday through Friday, from 9 AM to 1 PM for an assessment. Contact information: [address] [place] [address] [phone]         [facility]. Go on 11/16/2023.   Specialty: Behavioral Health Why: Please go to this provider on 11/20/2023 at 7 AM for medication management services.  If the time presented is not feasible, you can walk-in Monday through Friday, arrive by 7:00 AM for an assessment. Contact information: [address] [place] [address] [phone]                Next level of care provider has access to [facility] Link:no  Safety Planning and Suicide Prevention discussed: Yes,  with patient  Has patient been referred to the Quitline?: Patient admitted to smoking Black & Mild cigars (2 - 3 per day).  CSW gave him a print-out.  Patient has been referred for addiction treatment: Patient admitted to smoking marijuana to "calm my nerves, keep me at ease."  He said that he will no longer use it because "medication is doing the same thing."  He said that he utilizes coping skills, such as distraction.     Sydnei Ohaver O Takiah Maiden, LCSWA 11/13/2023, 9:57 AM

## 2023-11-13 NOTE — Progress Notes (Signed)
   11/13/23 0755  Psych Admission Type (Psych Patients Only)  Admission Status Involuntary  Psychosocial Assessment  Patient Complaints None  Eye Contact Fair  Facial Expression Animated  Affect Appropriate to circumstance  Speech Logical/coherent  Interaction Assertive  Motor Activity Other (Comment) (WDL)  Appearance/Hygiene Unremarkable  Behavior Characteristics Cooperative;Appropriate to situation  Mood Pleasant  Thought Process  Coherency WDL  Content WDL  Delusions None reported or observed  Perception WDL  Hallucination None reported or observed  Judgment Poor  Confusion None  Danger to Self  Current suicidal ideation? Denies  Agreement Not to Harm Self Yes  Description of Agreement Verbal  Danger to Others  Danger to Others None reported or observed

## 2023-11-13 NOTE — Progress Notes (Signed)
 Patient ID: Matthew Carney, male   DOB: 1975/12/17, 48 y.o.   MRN: [id number]  Patient discharged via taxi. Denies SI/HI/AVH. No distress noted.

## 2023-11-13 NOTE — Plan of Care (Signed)
 Patient did not complete goal. Patient did not demonstrate improved communication through spontaneous contributions to group sessions. Patient would only communicate when prompted/engaged to do so.   Venesa Semidey-McCall, LRT,CTRS

## 2023-11-13 NOTE — [facility] Suicide Risk Assessment (Signed)
 [facility] INPATIENT:  Family/Significant Other Suicide Prevention Education  Suicide Prevention Education:  Education Completed; with patient,  (name of family member/significant other) has been identified by the patient as the family member/significant other with whom the patient will be residing, and identified as the person(s) who will aid the patient in the event of a mental health crisis (suicidal ideations/suicide attempt).  With written consent from the patient, the family member/significant other has been provided the following suicide prevention education, prior to the and/or following the discharge of the patient.  Patient was given "Suicide Prevention Information" brochure.  Patient said that he doesn't have any guns, weapons or knives.  The suicide prevention education provided includes the following: Suicide risk factors Suicide prevention and interventions National Suicide Hotline telephone number [facility] assessment telephone number [facility] Emergency Assistance 911 [facility] and/or Residential Mobile Crisis Unit telephone number  Request made of family/significant other to: Remove weapons (e.g., guns, rifles, knives), all items previously/currently identified as safety concern.   Remove drugs/medications (over-the-counter, prescriptions, illicit drugs), all items previously/currently identified as a safety concern.  The family member/significant other verbalizes understanding of the suicide prevention education information provided.  The family member/significant other agrees to remove the items of safety concern listed above.  Zair Borawski O Lisbet Busker, LCSWA 11/13/2023, 9:44 AM

## 2023-11-13 NOTE — Plan of Care (Signed)
 Problem: Education: Goal: Knowledge of [facility] Education information/materials will improve Outcome: Adequate for Discharge Goal: Emotional status will improve 11/13/2023 1112 by Gardiner Barefoot, RN Outcome: Adequate for Discharge 11/13/2023 0759 by Gardiner Barefoot, RN Outcome: Progressing Goal: Mental status will improve 11/13/2023 1112 by Gardiner Barefoot, RN Outcome: Adequate for Discharge 11/13/2023 0759 by Gardiner Barefoot, RN Outcome: Progressing Goal: Verbalization of understanding the information provided will improve 11/13/2023 1112 by Gardiner Barefoot, RN Outcome: Adequate for Discharge 11/13/2023 0759 by Gardiner Barefoot, RN Outcome: Progressing   Problem: Activity: Goal: Interest or engagement in activities will improve Outcome: Adequate for Discharge Goal: Sleeping patterns will improve Outcome: Adequate for Discharge   Problem: Coping: Goal: Ability to verbalize frustrations and anger appropriately will improve Outcome: Adequate for Discharge Goal: Ability to demonstrate self-control will improve Outcome: Adequate for Discharge   Problem: Health Behavior/Discharge Planning: Goal: Identification of resources available to assist in meeting health care needs will improve Outcome: Adequate for Discharge Goal: Compliance with treatment plan for underlying cause of condition will improve Outcome: Adequate for Discharge   Problem: Physical Regulation: Goal: Ability to maintain clinical measurements within normal limits will improve Outcome: Adequate for Discharge   Problem: Safety: Goal: Periods of time without injury will increase Outcome: Adequate for Discharge   Problem: Education: Goal: Knowledge of [place] [facility] Education information/materials will improve Outcome: Adequate for Discharge Goal: Emotional status will improve Outcome: Adequate for Discharge Goal: Mental status will improve Outcome: Adequate for Discharge Goal:  Verbalization of understanding the information provided will improve Outcome: Adequate for Discharge   Problem: Activity: Goal: Interest or engagement in activities will improve Outcome: Adequate for Discharge Goal: Sleeping patterns will improve Outcome: Adequate for Discharge   Problem: Coping: Goal: Ability to verbalize frustrations and anger appropriately will improve Outcome: Adequate for Discharge Goal: Ability to demonstrate self-control will improve Outcome: Adequate for Discharge   Problem: Health Behavior/Discharge Planning: Goal: Identification of resources available to assist in meeting health care needs will improve Outcome: Adequate for Discharge Goal: Compliance with treatment plan for underlying cause of condition will improve Outcome: Adequate for Discharge   Problem: Physical Regulation: Goal: Ability to maintain clinical measurements within normal limits will improve Outcome: Adequate for Discharge   Problem: Safety: Goal: Periods of time without injury will increase Outcome: Adequate for Discharge   Problem: Coping: Goal: Coping ability will improve Outcome: Adequate for Discharge Goal: Will verbalize feelings Outcome: Adequate for Discharge   Problem: Health Behavior/Discharge Planning: Goal: Compliance with prescribed medication regimen will improve Outcome: Adequate for Discharge   Problem: Nutritional: Goal: Ability to achieve adequate nutritional intake will improve Outcome: Adequate for Discharge   Problem: Role Relationship: Goal: Ability to communicate needs accurately will improve Outcome: Adequate for Discharge Goal: Ability to interact with others will improve Outcome: Adequate for Discharge   Problem: Safety: Goal: Ability to redirect hostility and anger into socially appropriate behaviors will improve Outcome: Adequate for Discharge Goal: Ability to remain free from injury will improve Outcome: Adequate for Discharge   Problem:  Self-Care: Goal: Ability to participate in self-care as condition permits will improve Outcome: Adequate for Discharge   Problem: Self-Concept: Goal: Will verbalize positive feelings about self Outcome: Adequate for Discharge   Problem: Coping: Goal: Demonstration of participation in decision-making regarding own care will improve Outcome: Adequate for Discharge   Problem: Health Behavior/Discharge Planning: Goal: Identification of resources available to assist in meeting health care needs will improve Outcome: Adequate  for Discharge   Problem: Self-Concept: Goal: Will verbalize positive feelings about self Outcome: Adequate for Discharge   Problem: Coping: Goal: Ability to identify and develop effective coping behavior will improve Outcome: Adequate for Discharge

## 2023-11-13 NOTE — [facility] Suicide Risk Assessment (Signed)
 [facility] Discharge Suicide Risk Assessment   Principal Problem: Paranoid schizophrenia (HCC) Discharge Diagnoses: Principal Problem:   Paranoid schizophrenia (HCC)   Total Time spent with patient: 45 minutes  Reason for admission: Matthew Carney is a 48 y.o. male with a past psychiatric history of paranoid schizophrenia admitted voluntarily to the [facility]) for evaluation and management of paranoia about personal safety.   PTA Medications:  Current Patient denied taking any current medications prior to hospitalization  Hospital Course:   During the patient's hospitalization, patient had extensive initial psychiatric evaluation, and follow-up psychiatric evaluations every day.  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 Patient refused to switch to risperidone or Invega and to be started on Invega LAI to help with long-term compliance but he reported plan to comply with medications after discharge. Patient's care was discussed during the interdisciplinary team meeting every day during the hospitalization.  The patient denied having side effects to prescribed psychiatric medication.  Gradually, patient started adjusting to milieu. The patient was evaluated each day by a clinical provider to ascertain response to treatment. Improvement was noted by the patient's report of decreasing symptoms, improved sleep and appetite, affect, medication tolerance, behavior, and participation in  unit programming.  Patient was asked each day to complete a self inventory noting mood, mental status, pain, new symptoms, anxiety and concerns.    Symptoms were reported as significantly decreased or resolved completely by discharge.  Patient was counseled extensively regarding need to abstain completely from alcohol and marijuana after discharge, he agreed. On day of discharge, patient was evaluated on/1/25 the patient reports that their mood is stable. The patient denied having suicidal thoughts for more than 48 hours prior to discharge.  Patient denies having homicidal thoughts.  Patient denies having auditory hallucinations.  Patient denies any visual hallucinations or other symptoms of psychosis. The patient was motivated to continue taking medication with a goal of continued improvement in mental health.   The patient reports their target psychiatric symptoms of paranoia, auditory and visual hallucinations responded well to the psychiatric medications, and the patient reports overall benefit other psychiatric hospitalization. Supportive psychotherapy was provided to the patient. The patient also participated in regular group therapy while hospitalized. Coping skills, problem solving as well as relaxation therapies were also part of the unit programming.  Labs were reviewed with the patient, and abnormal results were discussed with the patient.  The patient is able to verbalize their individual safety plan to this provider.  Behavioral Events: None  Restraints: None  Groups: Attended and participated  Medications Changes: As above  D/C Medications: Zyprexa 10 mg twice daily for psychosis, hydroxyzine 25 mg 3 times daily as needed for anxiety and trazodone 50 mg at bedtime as needed for sleep, Nicorette 2 mg gum as needed for smoking cessation.  Sleep  Sleep:Sleep: Fair   Musculoskeletal: Strength & Muscle Tone: within normal limits Gait & Station: normal Patient leans:  N/A  Psychiatric Specialty Exam  General Appearance: appears at stated age, fairly dressed and groomed  Behavior: pleasant and cooperative  Psychomotor Activity:No  psychomotor agitation or retardation noted   Eye Contact: good Speech: normal amount, tone, volume and latency   Mood: euthymic Affect: congruent, pleasant and interactive  Thought Process: linear, goal directed, no circumstantial or tangential thought process noted, no racing thoughts or flight of ideas Descriptions of Associations: intact Thought Content: Hallucinations: denies AH, VH , does not appear responding to stimuli Delusions: No paranoia or other delusions noted Suicidal Thoughts: denies SI, intention, plan  Homicidal Thoughts: denies HI, intention, plan   Alertness/Orientation: alert and fully oriented  Insight: fair, improved Judgment: fair, improved  Memory: intact  Executive Functions  Concentration: intact  Attention Span: Fair Recall: intact Fund of Knowledge: fair   Art therapist  Concentration: intact Attention Span: Fair Recall: intact Fund of Knowledge: fair   Assets  Assets: Manufacturing systems engineer; Desire for Improvement; Physical Health; Resilience   Physical Exam: Physical Exam ROS Blood pressure 130/79, pulse 63, temperature 98.3 F (36.8 C), temperature source Oral, resp. rate 16, height 5\' 11"  (1.803 m), weight 72.6 kg, SpO2 100%. Body mass index is 22.32 kg/m.  Mental Status Per Nursing Assessment::   On Admission:  NA  Demographic Factors:  Male, Low socioeconomic status, and Unemployed  Loss Factors: NA  Historical Factors: Prior suicide attempts and Impulsivity  Risk Reduction Factors:   NA  Continued Clinical Symptoms: Paranoia, auditory and visual hallucinations improved significantly during hospital stay with no further symptoms present at time of discharge. Alcohol/Substance Abuse/Dependencies Schizophrenia:   Paranoid or undifferentiated  type  Cognitive Features That Contribute To Risk:  None    Suicide Risk:  Minimal: No identifiable suicidal ideation.  Patients presenting with no risk factors but with morbid ruminations; may be classified as minimal risk based on the severity of the depressive symptoms   Follow-up Information     [facility] Go on 11/15/2023.   Specialty: Professional Counselor Why: Please go to this provider for therapy services on Monday through Friday, from 9 am to 1 pm for an assessment. Contact information: [address] [place] [address] [phone]         [facility]. Go on 11/16/2023.   Specialty: Behavioral Health Why: Please go to this provider for medication management services on Monday through Friday, arrive by 7:00 am for an assessment. Contact information: [address] [place] [address] [phone]                Plan Of Care/Follow-up recommendations:   Discharge recommendations:    Activity: as tolerated  Diet: heart healthy  # It is recommended to the patient to continue psychiatric medications as prescribed, after discharge from the hospital.     # It is recommended to the patient to follow up with your outpatient psychiatric provider and PCP.   # It was discussed with the patient, the impact of alcohol, drugs, tobacco have been there overall psychiatric and medical wellbeing, and total abstinence from substance use was recommended the patient.ed.   # Prescriptions provided or sent directly to preferred pharmacy at discharge. Patient agreeable to plan. Given opportunity to ask questions. Appears to feel comfortable with discharge.    # In the event of worsening symptoms, the patient is instructed to call the crisis hotline, 911 and or go to the nearest ED for appropriate evaluation and treatment of symptoms. To follow-up with primary care provider for other medical issues, concerns  and or health care needs   # Patient was discharged home  with a plan to follow up as noted above.   Patient agrees with D/C instructions and plan.  The patient received suicide prevention pamphlet:  Yes Belongings returned:  Clothing and Valuables  Total Time Spent in Direct Patient Care:  I personally spent 45 minutes on the unit in direct patient care. The direct patient care time included face-to-face time with the patient, reviewing the patient's chart, communicating with other professionals, and coordinating care. Greater than 50% of this time was spent in counseling or coordinating care with the patient regarding goals of hospitalization, psycho-education, and discharge planning needs.   Aily Tzeng Abbott Pao, MD 11/13/2023, 9:22 AM

## 2023-11-13 NOTE — Group Note (Signed)
 Date:  11/13/2023 Time:  9:54 AM  Group Topic/Focus:  Coping With Mental Health Crisis:   The purpose of this group is to help patients identify strategies for coping with mental health crisis.  Group discusses possible causes of crisis and ways to manage them effectively. Emotional Education:   The focus of this group is to discuss what feelings/emotions are, and how they are experienced. Goals Group:   The focus of this group is to help patients establish daily goals to achieve during treatment and discuss how the patient can incorporate goal setting into their daily lives to aide in recovery.    Participation Level:  Did Not Attend  Participation Quality:    Affect:    Cognitive:    Insight:   Engagement in Group:    Modes of Intervention:    Additional Comments:    Gardiner Barefoot 11/13/2023, 9:54 AM

## 2023-11-13 NOTE — Progress Notes (Signed)
 Collateral contact - Anne Fu (mom) [phone]   Mom said that patient couldn't come to her home at discharge because "I don't have space right now."  She advised to call patient's father.    Mom was at the doctor's office, and she has a cataract surgery today.   Jeferson Boozer, LCSWA 11/13/2023

## 2023-11-13 NOTE — Progress Notes (Signed)
 Recreation Therapy Notes  INPATIENT RECREATION TR PLAN  Patient Details Name: Matthew Carney MRN: [id number] DOB: Jun 21, 1976 Today's Date: 11/13/2023  Rec Therapy Plan Is patient appropriate for Therapeutic Recreation?: Yes Treatment times per week: about 3 days Estimated Length of Stay: 5-7 days TR Treatment/Interventions: Group participation (Comment)  Discharge Criteria Pt will be discharged from therapy if:: Discharged Treatment plan/goals/alternatives discussed and agreed upon by:: Patient/family  Discharge Summary Short term goals set: See patient care plan Short term goals met: Not met Progress toward goals comments: Groups attended Which groups?: Other (Comment) (Decision Making; Music Therapy) Reason goals not met: Patient wasn't spontaneous in contributions during groups. Therapeutic equipment acquired: N/A Reason patient discharged from therapy: Discharge from hospital Pt/family agrees with progress & goals achieved: Yes Date patient discharged from therapy: 11/13/23   Ellen Goris-McCall, LRT,CTRS Girtha Kilgore A Ether Goebel-McCall 11/13/2023, 1:46 PM

## 2023-11-15 ENCOUNTER — Other Ambulatory Visit

## 2023-12-14 ENCOUNTER — Inpatient Hospital Stay: Admission: RE | Admit: 2023-12-14 | Source: Ambulatory Visit

## 2024-01-03 ENCOUNTER — Other Ambulatory Visit: Payer: MEDICAID

## 2024-01-03 ENCOUNTER — Inpatient Hospital Stay: Admission: RE | Admit: 2024-01-03 | Source: Ambulatory Visit

## 2024-02-04 ENCOUNTER — Ambulatory Visit (INDEPENDENT_AMBULATORY_CARE_PROVIDER_SITE_OTHER): Payer: MEDICAID | Admitting: Licensed Clinical Social Worker

## 2024-02-04 ENCOUNTER — Encounter (HOSPITAL_COMMUNITY): Payer: Self-pay | Admitting: Licensed Clinical Social Worker

## 2024-02-04 DIAGNOSIS — F4320 Adjustment disorder, unspecified: Secondary | ICD-10-CM | POA: Diagnosis not present

## 2024-02-04 DIAGNOSIS — F1021 Alcohol dependence, in remission: Secondary | ICD-10-CM

## 2024-02-04 DIAGNOSIS — F1411 Cocaine abuse, in remission: Secondary | ICD-10-CM | POA: Diagnosis not present

## 2024-02-04 DIAGNOSIS — F4322 Adjustment disorder with anxiety: Secondary | ICD-10-CM | POA: Insufficient documentation
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
## 2024-03-13 ENCOUNTER — Ambulatory Visit (INDEPENDENT_AMBULATORY_CARE_PROVIDER_SITE_OTHER): Payer: MEDICAID | Admitting: Licensed Clinical Social Worker

## 2024-03-13 DIAGNOSIS — F1411 Cocaine abuse, in remission: Secondary | ICD-10-CM | POA: Diagnosis not present

## 2024-03-13 DIAGNOSIS — F4322 Adjustment disorder with anxiety: Secondary | ICD-10-CM | POA: Diagnosis not present

## 2024-03-13 DIAGNOSIS — F1021 Alcohol dependence, in remission: Secondary | ICD-10-CM

## 2024-03-13 NOTE — Progress Notes (Signed)
 THERAPIST PROGRESS NOTE  Virtual Visit via Video Note  I connected with [facility] on 03/13/24 at 11:00 AM EDT by a video enabled telemedicine application and verified that I am speaking with the correct person using two identifiers.  Location: Patient: [facility]  Provider: Providers Home office    I discussed the limitations of evaluation and management by telemedicine and the availability of in person appointments. The patient expressed understanding and agreed to proceed.     I discussed the assessment and treatment plan with the patient. The patient was provided an opportunity to ask questions and all were answered. The patient agreed with the plan and demonstrated an understanding of the instructions.   The patient was advised to call back or seek an in-person evaluation if the symptoms worsen or if the condition fails to improve as anticipated.  I provided 30 minutes of non-face-to-face time during this encounter.   Matthew GORMAN Patee, LCSW   Participation Level: Active  Behavioral Response: CasualAlertAnxious and Depressed  Type of Therapy: Individual Therapy  Treatment Goals addressed:  Active     Substance Use     LTG: Matthew [facility]ney will improve quality of life by maintaining ongoing abstinence from all mood-altering substances     Start:  03/13/24         LTG: Matthew [facility]ney will increase coping skills to promote long-term recovery and improve ability to perform daily activities     Start:  03/13/24         STG: Matthew [facility]ney will complete at least 80% of assigned homework      Start:  03/13/24         Work with Matthew to track symptoms, triggers, and/or skill use through a mood chart, diary card, or journal     Start:  03/13/24         Encourage Matthew [facility]ney to participate in recovery peer support activities weekly      Start:  03/13/24         Conduct a review of all medications to evaluate any drug/drug interactions     Start:  03/13/24          Review with Matthew their response to the prescribed medication, including any side effects      Start:  03/13/24         Educate Matthew on any questions they may have regarding medication     Start:  03/13/24            ProgressTowards Goals: Initial  Interventions: CBT, Motivational Interviewing, and Supportive   Suicidal/Homicidal: Nowithout intent/plan  Therapist Response:    Matthew Carney was alert and oriented x 5.  He was pleasant, cooperative, maintained good eye contact.  Engaged well in therapy session was dressed casually.  Patient presented with anxious and irritable mood\affect.  Matthew Carney reports primary stressors as social drivers such as housing, transportation, and Education officer, community.  Matthew Carney also reports secondary stressors for legal.  Matthew Carney has been looking for housing since leaving his friend's home due to his friend drinking too much alcohol.  Matthew Carney reports that it caused irritability and consistent verbal altercations.  Currently patient is living [place] a bridge and he is looking into shelters in the area.  Barriers for Matthew Carney are his legal issues with him being on a sex offender registry.  He reports the only place that is in the area where he [facility] that he is allowed to go to his in [place] [facility] and he has been trying to  get a hold of the placement coordinator for 3 days.  Matthew Carney states that his only alternative to that would be rehab or psychiatric hold.  Matthew Carney wants to do everything and has power to stay out of prison but reports it has been hard since at least in prison he gets a shower in 3 meals a day.  Intervention/plan: LCSW validated feelings in session.  LCSW utilized supportive therapy for praise and encouragement.  LCSW utilized psychoanalytic therapy for patient to express thoughts, feelings and emotions and nonjudgmental environment.  LCSW utilize open-ended questions, reflective listening, and positive affirmations for motivational  interviewing.    Plan: Return again in 4 weeks.  Diagnosis: Alcohol use disorder, moderate, in early remission, dependence (HCC)  Cocaine use disorder, mild, in early remission, abuse (HCC)  Adjustment disorder with anxious mood  Collaboration of Care: Other None today   Patient/Guardian was advised Release of Information must be obtained prior to any record release in order to collaborate their care with an outside provider. Patient/Guardian was advised if they have not already done so to contact the registration department to sign all necessary forms in order for us  to release information regarding their care.   Consent: Patient/Guardian gives verbal consent for treatment and assignment of benefits for services provided during this visit. Patient/Guardian expressed understanding and agreed to proceed.   Matthew GORMAN Patee, LCSW 03/13/2024

## 2024-04-24 ENCOUNTER — Ambulatory Visit (HOSPITAL_COMMUNITY)
Admission: EM | Admit: 2024-04-24 | Discharge: 2024-04-24 | Disposition: A | Discharge status: Home / Self Care | Payer: MEDICAID | Attending: Psychiatry | Admitting: Psychiatry

## 2024-04-24 DIAGNOSIS — F1410 Cocaine abuse, uncomplicated: Secondary | ICD-10-CM | POA: Diagnosis not present

## 2024-04-24 DIAGNOSIS — R4689 Other symptoms and signs involving appearance and behavior: Secondary | ICD-10-CM

## 2024-04-24 DIAGNOSIS — Z653 Problems related to other legal circumstances: Secondary | ICD-10-CM | POA: Insufficient documentation

## 2024-04-24 DIAGNOSIS — F1910 Other psychoactive substance abuse, uncomplicated: Secondary | ICD-10-CM

## 2024-04-24 DIAGNOSIS — Z5900 Homelessness unspecified: Secondary | ICD-10-CM | POA: Insufficient documentation

## 2024-04-24 DIAGNOSIS — F1590 Other stimulant use, unspecified, uncomplicated: Secondary | ICD-10-CM | POA: Diagnosis not present

## 2024-04-24 DIAGNOSIS — F919 Conduct disorder, unspecified: Secondary | ICD-10-CM | POA: Diagnosis not present

## 2024-04-24 DIAGNOSIS — Z560 Unemployment, unspecified: Secondary | ICD-10-CM | POA: Insufficient documentation

## 2024-04-24 DIAGNOSIS — Z91148 Patient's other noncompliance with medication regimen for other reason: Secondary | ICD-10-CM | POA: Insufficient documentation

## 2024-04-24 MED ORDER — OLANZAPINE 10 MG IM SOLR: 10.0000 mg | Freq: Three times a day (TID) | INTRAMUSCULAR | Status: DC | PRN

## 2024-04-24 MED ORDER — ACETAMINOPHEN 325 MG PO TABS: 650.0000 mg | ORAL_TABLET | Freq: Four times a day (QID) | ORAL | Status: DC | PRN

## 2024-04-24 MED ORDER — OLANZAPINE 5 MG PO TBDP: 5.0000 mg | ORAL_TABLET | Freq: Three times a day (TID) | ORAL | Status: DC | PRN

## 2024-04-24 MED ORDER — OLANZAPINE 10 MG IM SOLR: 5.0000 mg | Freq: Three times a day (TID) | INTRAMUSCULAR | Status: DC | PRN

## 2024-04-24 MED ORDER — MAGNESIUM HYDROXIDE 400 MG/5ML PO SUSP: 30.0000 mL | Freq: Every day | ORAL | Status: DC | PRN

## 2024-04-24 MED ORDER — ALUM & MAG HYDROXIDE-SIMETH 200-200-20 MG/5ML PO SUSP: 30.0000 mL | ORAL | Status: DC | PRN

## 2024-04-24 NOTE — Progress Notes (Signed)
   04/24/24 2029  [facility] Triage Screening (Walk-ins at [facility] only)  How Did You Hear About Us ? Self  What Is the Reason for Your Visit/Call Today? Pt presents to [facility] as a voluntary walk-in, unaccompanied requesting substance abuse treatment. Pt reports using crack and crystal meth and last used this morning. Pt reports using drugs on a daily basis for about 6 months. Pt reports diagnosis of anxiety and paranoid schizophrenia and has not had medications in two months. Pt also reports experiencing homelessness at this time. Pt currently denies SI,HI,AVH and alcohol use.  How Long Has This Been Causing You Problems? > than 6 months  Have You Recently Had Any Thoughts About Hurting Yourself? No  Are You Planning to Commit Suicide/Harm Yourself At This time? No  Have you Recently Had Thoughts About Hurting Someone [place]? No  Are You Planning To Harm Someone At This Time? No  Physical Abuse Denies  Verbal Abuse Denies  Sexual Abuse Denies  Exploitation of patient/patient's resources Denies  Self-Neglect Denies  Are you currently experiencing any auditory, visual or other hallucinations? No  Have You Used Any Alcohol or Drugs in the Past 24 Hours? Yes  What Did You Use and How Much? crack and crystal meth  Do you have any current medical co-morbidities that require immediate attention? No  Clinician description of patient physical appearance/behavior: cooperative, calm, oriented  What Do You Feel Would Help You the Most Today? Alcohol or Drug Use Treatment  If access to [facility] was not available, would you have sought care in the Emergency Department? Yes  Determination of Need Urgent (48 hours)  Options For Referral Other: Comment;Chemical Dependency Intensive Outpatient Therapy (CDIOP);Facility-Based Crisis  Determination of Need filed? Yes

## 2024-04-24 NOTE — ED Provider Notes (Signed)
 Behavioral Health Urgent Care Medical Screening Exam  Patient Name: Matthew Carney MRN: [id number] Date of Evaluation: 04/24/24 Chief Complaint:   Diagnosis:  Final diagnoses:  Homelessness unspecified  Polysubstance abuse (HCC)  Aggression aggravated   HPI: Matthew Carney, 48 y/o male with a history of paranoid schizophrenia, behavioral issues which include aggression, polysubstance abuse (cocaine, crystal meth) presented to [facility] voluntarily.  Per the patient he has been out of his medicine and he did not know he had to go get new medicines.  Patient stated that he has been using crystal meth and cocaine from yesterday all night even into this morning.  Patient is currently homeless, unemployed.  Noncompliant with medication regiment.  Review of patient records show multiple inpatient admission last admission seen as reviewed by chart was April 2025.  Patient also reports he is currently not seeing a psychiatrist or therapist.  Has not been followed by an ACT team.   Face-to-face evaluation of patient, patient is alert and oriented x 4, speech is clear, maintained minimal eye contact.  Patient does have an ankle bracelet on stating that he is currently on probation for sexual assault, kidnapping, and aggressive behavior.  Patient currently denies SI, HI, AVH or paranoia.  Denies alcohol use.  Endorses smoking methamphetamines and crack cocaine with crystal meth for the past 24 hours straight.  According to patient he needs somewhere to stay for a while.  Writer also discussed with patient that he should also reach out to the main shelter for assistance for housing.  Writer did consult with Dr. Cole MD who recommended patient do IOP,  Recommend discharge for patient to follow-up with walk-in psychiatry in the a.m. so he can get into IOP.  Flowsheet Row ED from 04/24/2024 in [facility] Counselor from 02/04/2024 in [facility] Ad[facility]  (Discharged) [facility]  C-SSRS RISK CATEGORY No Risk Moderate Risk No Risk    Psychiatric Specialty Exam  Presentation  General Appearance:Casual  Eye Contact:Fair  Speech:Clear and Coherent  Speech Volume:Increased  Handedness:Right   Mood and Affect  Mood: Euthymic  Affect: Congruent   Thought Process  Thought Processes: Linear  Descriptions of Associations:Intact  Orientation:Full (Time, Place and Person)  Thought Content:WDL  Diagnosis of Schizophrenia or Schizoaffective disorder in past: Yes  Duration of Psychotic Symptoms: Greater than six months  Hallucinations:None  Ideas of Reference:None  Suicidal Thoughts:No  Homicidal Thoughts:No   Sensorium  Memory: Immediate Fair  Judgment: Fair  Insight: Fair   Art therapist  Concentration: Fair  Attention Span: Fair  Recall: [employer] of Knowledge: Fair  Language: Fair   Psychomotor Activity  Psychomotor Activity: Normal   Assets  Assets: Desire for Improvement; Housing; Resilience   Sleep  Sleep: Fair  Number of hours:  6   Physical Exam: Physical Exam HENT:     Head: Normocephalic.     Nose: Nose normal.  Eyes:     Pupils: Pupils are equal, round, and reactive to light.  Cardiovascular:     Rate and Rhythm: Normal rate.  Pulmonary:     Effort: Pulmonary effort is normal.  Musculoskeletal:        General: Normal range of motion.     Cervical back: Normal range of motion.  Neurological:     General: No focal deficit present.     Mental Status: He is alert.  Psychiatric:        Mood  and Affect: Mood normal.        Behavior: Behavior normal.        Thought Content: Thought content normal.        Judgment: Judgment normal.    Review of Systems  Constitutional: Negative.   HENT: Negative.    Eyes: Negative.   Respiratory: Negative.    Cardiovascular: Negative.   Gastrointestinal: Negative.    Genitourinary: Negative.   Musculoskeletal: Negative.   Skin: Negative.   Neurological: Negative.   Psychiatric/Behavioral:  Positive for substance abuse. The patient is nervous/anxious.    Blood pressure (!) 141/102, pulse 87, temperature 98.4 F (36.9 C), temperature source Oral, resp. rate 20, SpO2 100%. There is no height or weight on file to calculate BMI.  Musculoskeletal: Strength & Muscle Tone: within normal limits Gait & Station: normal Patient leans: N/A   [facility] MSE Discharge Disposition for Follow up and Recommendations: Based on my evaluation the patient does not appear to have an emergency medical condition and can be discharged with resources and follow up care in outpatient services for Substance Abuse Intensive Outpatient Program   Gaither Pouch, NP 04/24/2024, 10:13 PM
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
## 2024-04-24 NOTE — Discharge Instructions (Addendum)
 Follow-up with walking psychiatry
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
## 2024-04-29 ENCOUNTER — Other Ambulatory Visit: Payer: Self-pay

## 2024-04-29 ENCOUNTER — Emergency Department (HOSPITAL_COMMUNITY)
Admission: EM | Admit: 2024-04-29 | Discharge: 2024-04-30 | Disposition: A | Discharge status: Home / Self Care | Payer: MEDICAID | Attending: Emergency Medicine | Admitting: Emergency Medicine

## 2024-04-29 ENCOUNTER — Encounter (HOSPITAL_COMMUNITY): Payer: Self-pay

## 2024-04-29 DIAGNOSIS — R45850 Homicidal ideations: Secondary | ICD-10-CM

## 2024-04-29 DIAGNOSIS — F1910 Other psychoactive substance abuse, uncomplicated: Secondary | ICD-10-CM | POA: Diagnosis present

## 2024-04-29 DIAGNOSIS — R45851 Suicidal ideations: Secondary | ICD-10-CM

## 2024-04-29 DIAGNOSIS — F1920 Other psychoactive substance dependence, uncomplicated: Secondary | ICD-10-CM | POA: Diagnosis not present

## 2024-04-29 DIAGNOSIS — Z8659 Personal history of other mental and behavioral disorders: Secondary | ICD-10-CM | POA: Diagnosis not present

## 2024-04-29 DIAGNOSIS — R441 Visual hallucinations: Secondary | ICD-10-CM | POA: Insufficient documentation

## 2024-04-29 DIAGNOSIS — R440 Auditory hallucinations: Secondary | ICD-10-CM | POA: Diagnosis not present

## 2024-04-29 DIAGNOSIS — Z5902 Unsheltered homelessness: Secondary | ICD-10-CM | POA: Diagnosis not present

## 2024-04-29 DIAGNOSIS — R443 Hallucinations, unspecified: Secondary | ICD-10-CM

## 2024-04-29 LAB — SALICYLATE LEVEL: Salicylate Lvl: <7 mg/dL — ABNORMAL LOW (ref 7.0–30.0)

## 2024-04-29 LAB — CBC WITH DIFFERENTIAL/PLATELET
Abs Immature Granulocytes: 0.02 K/uL (ref 0.00–0.07)
Basophils Absolute: 0 K/uL (ref 0.0–0.1)
Basophils Relative: 1 %
Eosinophils Absolute: 0.1 K/uL (ref 0.0–0.5)
Eosinophils Relative: 1 %
HCT: 42.8 % (ref 39.0–52.0)
Hemoglobin: 13.7 g/dL (ref 13.0–17.0)
Immature Granulocytes: 0 %
Lymphocytes Relative: 23 %
Lymphs Abs: 1.9 K/uL (ref 0.7–4.0)
MCH: 31.3 pg (ref 26.0–34.0)
MCHC: 32 g/dL (ref 30.0–36.0)
MCV: 97.7 fL (ref 80.0–100.0)
Monocytes Absolute: 0.4 K/uL (ref 0.1–1.0)
Monocytes Relative: 5 %
Neutro Abs: 5.9 K/uL (ref 1.7–7.7)
Neutrophils Relative %: 70 %
Platelets: 203 K/uL (ref 150–400)
RBC: 4.38 MIL/uL (ref 4.22–5.81)
RDW: 11.1 % — ABNORMAL LOW (ref 11.5–15.5)
WBC: 8.4 K/uL (ref 4.0–10.5)
nRBC: 0 % (ref 0.0–0.2)

## 2024-04-29 LAB — COMPREHENSIVE METABOLIC PANEL WITH GFR
ALT: 19 U/L (ref 0–44)
AST: 28 U/L (ref 15–41)
Albumin: 4.2 g/dL (ref 3.5–5.0)
Alkaline Phosphatase: 52 U/L (ref 38–126)
Anion gap: 12 (ref 5–15)
BUN: 8 mg/dL (ref 6–20)
CO2: 27 mmol/L (ref 22–32)
Calcium: 9.5 mg/dL (ref 8.9–10.3)
Chloride: 102 mmol/L (ref 98–111)
Creatinine, Ser: 1.07 mg/dL (ref 0.61–1.24)
GFR, Estimated: >60 mL/min (ref >60)
Glucose, Bld: 82 mg/dL (ref 70–99)
Potassium: 4 mmol/L (ref 3.5–5.1)
Sodium: 141 mmol/L (ref 135–145)
Total Bilirubin: 0.7 mg/dL (ref 0.0–1.2)
Total Protein: 7.1 g/dL (ref 6.5–8.1)

## 2024-04-29 LAB — ACETAMINOPHEN LEVEL: Acetaminophen (Tylenol), Serum: <10 ug/mL — ABNORMAL LOW (ref 10–30)

## 2024-04-29 LAB — ETHANOL: Alcohol, Ethyl (B): <15 mg/dL (ref <15)

## 2024-04-29 LAB — RAPID URINE DRUG SCREEN, HOSP PERFORMED
Amphetamines: NOT DETECTED
Barbiturates: NOT DETECTED
Benzodiazepines: POSITIVE — AB
Cocaine: POSITIVE — AB
Opiates: NOT DETECTED
Tetrahydrocannabinol: POSITIVE — AB

## 2024-04-29 NOTE — ED Notes (Signed)
 Pt offered with bag of [place] sandwich and cup of soda

## 2024-04-29 NOTE — ED Provider Triage Note (Signed)
 Emergency Medicine Provider Triage Evaluation Note  Matthew Carney , a 48 y.o. male  was evaluated in triage.  Pt complains of heavy drug use including cocaine and meth.  The last 4 days has had visual and auditory hallucinations.  Reports he is not having any SI or HI.   Review of Systems  Positive: Hallucinations, drug use Negative: SI  Physical Exam  BP 125/88   Pulse 89   Temp 98.5 F (36.9 C)   Resp 16   Ht 5' 11 (1.803 m)   SpO2 100%   BMI 22.32 kg/m  Gen:   Awake, no distress   Resp:  Normal effort  MSK:   Moves extremities without difficulty  Other:    Medical Decision Making  Medically screening exam initiated at 6:41 PM.  Appropriate orders placed.  Matthew Carney was informed that the remainder of the evaluation will be completed by another provider, this initial triage assessment does not replace that evaluation, and the importance of remaining in the ED until their evaluation is complete.     Matthew Warren SAILOR, PA-C 04/29/24 1842

## 2024-04-29 NOTE — ED Triage Notes (Signed)
 Pt endorses he has been having auditory & visual hallucinations & needs help getting back on his meds, continues to deny SI/HI.

## 2024-04-29 NOTE — ED Triage Notes (Signed)
 Pt has been using crack, meth and THC and has been off his mental health meds.  Pt is calm and cooperative, denies any SI/HI and expresses desire to get help, he is currently experiencing homelessness.

## 2024-04-29 NOTE — ED Notes (Signed)
 Pt dressed out, wanded by security. Pt belongings bag in purple locker #2

## 2024-04-29 NOTE — BH Assessment (Addendum)
@  2230: Patient was referred to [facility]. The referral was accepted by [facility] Coordinator, Holly, at [phone]. TTS Clinician provided requested details to the coordinator. [facility] will notify the ED care team once a provider is available to assess the patient. For any further questions, please contact the [facility] Coordinator directly.

## 2024-04-29 NOTE — ED Provider Notes (Signed)
 [place] EMERGENCY DEPARTMENT AT [facility] Provider Note   CSN: [id number] Arrival date & time: 04/29/24  1744     Patient presents with: Psychiatric Evaluation   Matthew Carney is a 48 y.o. male with past medical history of cocaine use disorder, alcohol use disorder, anxiety, schizophrenia has presented to emergency room with complaint of heavy drug abuse especially over the past 4 days.  He reports he has been having visual and auditory hallucinations.  He reports he feels like he is going to harm himself if he is dismissed out of the hospital.  He denies SI HI.  He does want to get back on his medicine.  He does express that he is feeling homeless.   HPI     Prior to Admission medications   Medication Sig Start Date End Date Taking? Authorizing Provider  hydrOXYzine  (ATARAX ) 25 MG tablet Take 1 tablet (25 mg total) by mouth 3 (three) times daily as needed for anxiety. 11/13/23   Evelena Figures, MD  nicotine  polacrilex (NICORETTE ) 2 MG gum Take 1 each (2 mg total) by mouth as needed for smoking cessation. 11/13/23   Evelena Figures, MD  OLANZapine  (ZYPREXA ) 10 MG tablet Take 1 tablet (10 mg total) by mouth every 12 (twelve) hours. 11/13/23   Evelena Figures, MD  traZODone  (DESYREL ) 50 MG tablet Take 1 tablet (50 mg total) by mouth at bedtime as needed for sleep. 11/13/23   Evelena Figures, MD    Allergies: Patient has no known allergies.    Review of Systems  Psychiatric/Behavioral:  Positive for agitation.     Updated Vital Signs BP 125/88   Pulse 89   Temp 98.5 F (36.9 C)   Resp 16   Ht 5' 11 (1.803 m)   SpO2 100%   BMI 22.32 kg/m   Physical Exam Vitals and nursing note reviewed.  Constitutional:      General: He is not in acute distress.    Appearance: He is not toxic-appearing.  HENT:     Head: Normocephalic and atraumatic.  Eyes:     General: No scleral icterus.    Conjunctiva/sclera: Conjunctivae normal.  Cardiovascular:     Rate and Rhythm: Normal rate  and regular rhythm.     Pulses: Normal pulses.     Heart sounds: Normal heart sounds.  Pulmonary:     Effort: Pulmonary effort is normal. No respiratory distress.     Breath sounds: Normal breath sounds.  Abdominal:     General: Abdomen is flat. Bowel sounds are normal.     Palpations: Abdomen is soft.     Tenderness: There is no abdominal tenderness.  Musculoskeletal:     Right lower leg: No edema.     Left lower leg: No edema.  Skin:    General: Skin is warm and dry.     Findings: No lesion.  Neurological:     General: No focal deficit present.     Mental Status: He is alert and oriented to person, place, and time. Mental status is at baseline.     (all labs ordered are listed, but only abnormal results are displayed) Labs Reviewed  RAPID URINE DRUG SCREEN, HOSP PERFORMED - Abnormal; Notable for the following components:      Result Value   Cocaine POSITIVE (*)    Benzodiazepines POSITIVE (*)    Tetrahydrocannabinol POSITIVE (*)    All other components within normal limits  CBC WITH DIFFERENTIAL/PLATELET - Abnormal; Notable for the following components:  RDW 11.1 (*)    All other components within normal limits  ACETAMINOPHEN  LEVEL - Abnormal; Notable for the following components:   Acetaminophen  (Tylenol ), Serum <10 (*)    All other components within normal limits  SALICYLATE LEVEL - Abnormal; Notable for the following components:   Salicylate Lvl <7.0 (*)    All other components within normal limits  COMPREHENSIVE METABOLIC PANEL WITH GFR  ETHANOL    EKG: None  Radiology: No results found.   Procedures   Medications Ordered in the ED - No data to display                                  Medical Decision Making Amount and/or Complexity of Data Reviewed Labs: ordered.   This patient presents to the ED for concern of drug use, this involves an extensive number of treatment options, and is a complaint that carries with it a high risk of complications and  morbidity.  The differential diagnosis includes hallucinations, acute psychosis, SI, HI   Co morbidities that complicate the patient evaluation  History of drug use   Lab Tests:  I personally interpreted labs.  The pertinent results include:   Lab work is overall unremarkable but showing that he is positive for cocaine, benzos and THC.   Cardiac Monitoring: / EKG:  The patient was maintained on a cardiac monitor.  I personally viewed and interpreted the cardiac monitored which showed an underlying rhythm of: EKG shows normal sinus rhythm   Problem List / ED Course / Critical interventions / Medication management  Patient is complaining of auditory visual hallucination after heavy alcohol use.  He denies SI or HI.  He is not expressing any pain.  His lungs are clear to auscultation bilaterally.  He has no focal abdominal pain.  His labs are overall reassuring here.  With no electrolyte abnormality.  He has normal salicylate and acetaminophen  level.  His EKG shows normal sinus rhythm.  His drug screen is positive. Patient has been medically cleared.  Appropriate to speak to behavioral health.  I have ordered home diet.  Stable and well-appearing.      Final diagnoses:  Polysubstance abuse [facility])  Hallucinations    ED Discharge Orders     None          Shermon Warren LOISE DEVONNA 04/29/24 2045    Freddi Hamilton, MD 04/29/24 2134

## 2024-04-29 NOTE — ED Notes (Signed)
 Patient given a [place] sandwich and drink. Patient is currently laying in bed. Equal rise and fall of chest noted.

## 2024-04-30 ENCOUNTER — Ambulatory Visit (HOSPITAL_COMMUNITY)
Admission: EM | Admit: 2024-04-30 | Discharge: 2024-04-30 | Disposition: A | Discharge status: Home / Self Care | Payer: MEDICAID | Source: Intra-hospital

## 2024-04-30 ENCOUNTER — Encounter (HOSPITAL_COMMUNITY): Payer: Self-pay | Admitting: Psychiatry

## 2024-04-30 DIAGNOSIS — R441 Visual hallucinations: Secondary | ICD-10-CM | POA: Insufficient documentation

## 2024-04-30 DIAGNOSIS — F1910 Other psychoactive substance abuse, uncomplicated: Secondary | ICD-10-CM | POA: Insufficient documentation

## 2024-04-30 DIAGNOSIS — Z5902 Unsheltered homelessness: Secondary | ICD-10-CM | POA: Insufficient documentation

## 2024-04-30 DIAGNOSIS — R45851 Suicidal ideations: Secondary | ICD-10-CM

## 2024-04-30 DIAGNOSIS — F1920 Other psychoactive substance dependence, uncomplicated: Secondary | ICD-10-CM

## 2024-04-30 DIAGNOSIS — R440 Auditory hallucinations: Secondary | ICD-10-CM | POA: Insufficient documentation

## 2024-04-30 DIAGNOSIS — Z8659 Personal history of other mental and behavioral disorders: Secondary | ICD-10-CM

## 2024-04-30 DIAGNOSIS — R45850 Homicidal ideations: Secondary | ICD-10-CM

## 2024-04-30 MED ORDER — OLANZAPINE 10 MG PO TABS: 10.0000 mg | ORAL_TABLET | Freq: Every day | ORAL | 0 refills | Status: AC

## 2024-04-30 MED ORDER — DIPHENHYDRAMINE HCL 25 MG PO CAPS: 50.0000 mg | ORAL_CAPSULE | Freq: Four times a day (QID) | ORAL | Status: DC | PRN

## 2024-04-30 MED ORDER — MIRTAZAPINE 15 MG PO TABS: 15.0000 mg | ORAL_TABLET | Freq: Every day | ORAL | Status: DC

## 2024-04-30 MED ORDER — OLANZAPINE 5 MG PO TBDP: 5.0000 mg | ORAL_TABLET | Freq: Two times a day (BID) | ORAL | Status: DC | PRN

## 2024-04-30 MED ORDER — OLANZAPINE 10 MG PO TABS
10.0000 mg | ORAL_TABLET | Freq: Every day | ORAL | Status: DC
  Administered 2024-04-30: 10 mg via ORAL
  Filled 2024-04-30: qty 1

## 2024-04-30 NOTE — Discharge Instructions (Addendum)
 [facility]: [address] [place], [place], [place] [address] Call and ask for intake [phone] ext. [phone].  [facility]: [address] [place], [place] [phone]   You may go to the [facility] to make phone calls for intake at [facility].       Based on the information that you have provided and the presenting issues outpatient services and resources for have been recommended.  It is imperative that you follow through with treatment recommendations within 5-7 days from the of discharge to mitigate further risk to your safety and mental well-being. A list of referrals has been provided below to get you started.  You are not limited to the list provided.  In case of an urgent crisis, you may contact the Mobile Crisis Unit with [facility] at [phone].   [facility] in [place], [place] CAN HELP [phone]   About Our [facility] Location [facility] in [place], [place]  helps families and individuals who are suffering from drug and alcohol addiction recover through programs and support to begin new lives. Please know that there is help available for you and you can start fresh, it just takes one call to get started. Do not hesitate to reach out to [facility] in [place], [place]  today.  Our [place] office provides premier treatment and recovery programs to help recovering addicts. We make it possible for you to break free from addiction through healthy living environments, educational programs, job assistance, and emotional support. Our goal is to equip you with the proper resources to succeed in addiction recovery.  If you live near [facility] and want to begin a new life free from drugs and alcohol, contact us  and we will help you on your path to recovery. [facility] in [place] is committed to providing the necessary resources, tools and education to allow you to live a  sober life.  What makes us  different? We can give you the skills you need to lead a drug-free life.  Without new life skills and an opportunity to practice clean living in a safe environment, you are at risk for a relapse.  Sober living is like a bridge that helps you cross over from addiction to a life free from substance abuse. Our sober living community is a safe and supportive drug-free community that allows you to adapt to your new life at your own pace.  Affordable Drug & Alcohol Recovery Program in [place] Receive the support education and skills to recover from alcohol and drug addiction at [facility] in [place]. Regardless of your financial situation, [facility] is committed to helping you live a better life. Our affordable services include:  Transitional Physicist, medical Life Skills  Take action and get help with [facility] in [place] today.   Benefits of So[facility] [employer] [facility] Services Offered [facility] Housing Apartment Style Living with Roommates Peer to Peer Recovery  Same Day Admission  No Funds Needed for Admission  Long-Term Geneticist, molecular  Freedom to Work  Higher education careers adviser  AA/NA Meetings - Onsite  AA/NA Meetings - Offsite  Licensed Counseling (In-House Program) Real World Experiences  Life Skills Classes  Relapse Prevention  Spiritual Development  AA Big Book Meetings  AA 12 Step Studies  Monthly Cost $800 mo. ($1,890 - [facility]) Ready for Help? Call [phone]

## 2024-04-30 NOTE — Medical Student Note (Signed)
 [facility] URGENT CARE Provider Student Note For educational purposes for Medical, PA and NP students only and not part of the legal medical record.   CSN: [id number] Arrival date & time: 04/30/24  0542      History   Chief Complaint   Help get off drugs using     HPI Argie D Hasan is a 48 y.o. male that presented to the [facility] unaccompanied and voluntary.   Patient was transferred from  [facility] Emergency to the  [facility].   Patient was assessed by IRIS Provider at 1:58 AM 04/30/2024. Per Iris Providser:  Recommend observation status in Behavioral Care Urgent Care until    metabolize illicit drugs and re-established on meds, then re-eval in   morning for potential appropriateness for referral to detox/rehab and   potential for sober living house;  If pt attempts to leave before   re-evaluation would recommend IVC until re-evaluation.   HPI   [name], 48 y.o., male patient seen face to face by this provider, consulted with Dr. Lawrnce ; and chart reviewed on 04/30/24. Patient has a history of Paranoid Schizophrenia and Polysubstance Abuse Disorder.    On evaluation MIRKO TAILOR reports feeling weak and tired. Patient reports wanting help for  the drugs he is taking. Patient was positive for Benzodiazapines, Cocaine and marijuana    Per patient and per chart review the patient is currently prescribed Olanzapine , Hydroxyzine , and Trazodone . Patient reports wanting to get back on his prescribed medication. Patient states has not been taking his prescribed medication due to no refill. Patient does endorse wanting to take his prescribed medication.  Patient also has not followed up with IOP and states has no transportation.  Patient is currently homeless.    During evaluation [name] initially has head down on table but did sit upright and  no acute distress noted.  Patient does seem drowsy. Patient alert/oriented x 4; calm/cooperative;  and mood congruent with affect.  Patient is speaking in a clear tone at low volume, and normal pace; with fleeting eye contact. Patient thought process is coherent, relevant; and goal directed. Patient was slightly guarded when asked about hallucinations. Patient stated hallucinations are less now.  There is no indication the patient is currently responding to internal/external stimuli or experiencing delusional thought content. Patient  has denied suicidal/self-harm/homicidal ideation.  Patient has remained calm throughout assessment and has answered questions appropriately.     At this time MARIANA GOYTIA is educated and verbalizes understanding of mental health resources and other crisis services in the community. Patient is instructed to call 911 and present to the nearest emergency room should he experience any suicidal/homicidal ideation, auditory/visual/hallucinations, or detrimental worsening of his mental health condition.  Patient was a also advised by Clinical research associate that he could call the toll-free phone on insurance card to assist with identifying in network counselors and agencies or number on back of Medicaid card t speak with care coordinator    No past medical history on file.  Patient Active Problem List   Diagnosis Date Noted   Polysubstance abuse (HCC) 04/30/2024   Homicidal ideation 04/30/2024   Adjustment disorder with anxious mood 02/04/2024   Cocaine use disorder, mild, in early remission, abuse (HCC) 02/04/2024   Alcohol use disorder, moderate, in early remission, dependence (HCC) 02/04/2024   Suicidal ideation    Paranoid schizophrenia (HCC)     No past surgical history on file.  Home Medications    Prior to Admission medications   Medication Sig Start Date End Date Taking? Authorizing Provider  hydrOXYzine  (ATARAX ) 25 MG tablet Take 1 tablet (25 mg total) by mouth 3 (three) times daily as needed for anxiety. Patient not taking: Reported on 04/30/2024 11/13/23   Evelena Figures, MD  nicotine  polacrilex (NICORETTE ) 2 MG gum Take 1 each (2 mg total) by mouth as needed for smoking cessation. Patient not taking: Reported on 04/30/2024 11/13/23   Evelena Figures, MD  OLANZapine  (ZYPREXA ) 10 MG tablet Take 1 tablet (10 mg total) by mouth every 12 (twelve) hours. Patient not taking: Reported on 04/30/2024 11/13/23   Evelena Figures, MD  traZODone  (DESYREL ) 50 MG tablet Take 1 tablet (50 mg total) by mouth at bedtime as needed for sleep. Patient not taking: Reported on 04/30/2024 11/13/23   Evelena Figures, MD    Family History No family history on file.  Social History Social History   Tobacco Use   Smoking status: Every Day    Types: E-cigarettes  Substance Use Topics   Alcohol use: Not Currently    Comment: 3 weeks. as much as he could get has not drank since 01/13/24 detox in [place]   Drug use: Not Currently    Types: Cocaine, Marijuana     Allergies   Patient has no known allergies.   Review of Systems Review of Systems   Physical Exam Updated Vital Signs BP 117/87 (BP Location: Right Arm)   Pulse 63   Temp 97.6 F (36.4 C) (Oral)   Resp 18   SpO2 100%   Physical Exam   ED Treatments / Results  Labs (all labs ordered are listed, but only abnormal results are displayed) Labs Reviewed - No data to display  EKG  Radiology No results found.  Procedures Procedures (including critical care time)  Medications Ordered in ED Medications - No data to display   Initial Impression / Assessment and Plan / ED Course  I have reviewed the triage vital signs and the nursing notes.  Pertinent labs & imaging results that were available during my care of the patient were reviewed by me and considered in my medical decision making (see chart for details).   -Schizophrenia - Patient will be given 30 prescriptions of medication, Olanzapine  10 mg at bedtime - Patient will be given referral  to [facility] for admission    Final Clinical  Impressions(s) / ED Diagnoses   Final diagnoses:  None    New Prescriptions New Prescriptions   No medications on file
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
## 2024-04-30 NOTE — Care Management (Signed)
 OBS Care Management   Writer left a HIPAA compliant voice mail message with the [facility] [phone]) for a shelter bed.  Patient will need to complete a telepone intake interview before he is able to go to the shelter.    The patient will need to continue to call back in order to seek a shelter bed.  Writer placed homeless shelter resources in his discharge AVS.  Writer informed the NP working with the patient.

## 2024-04-30 NOTE — ED Provider Notes (Signed)
 Behavioral Health Urgent Care Medical Screening Exam  Patient Name: Matthew Carney MRN: [id number] Date of Evaluation: 05/01/24 Chief Complaint:   I need to find housing Diagnosis:  Final diagnoses:  Polysubstance abuse (HCC)  Unsheltered homelessness   I personally spent a total of 30 minutes in the care of the patient today including preparing to see the patient, getting/reviewing separately obtained history, performing a medically appropriate exam/evaluation, counseling and educating, documenting clinical information in the EHR, and coordinating care.   History of Present illness: Matthew Carney is a 48 y.o. male, with a history of paranoid schizophrenia, polysubstance use, homelessness presents as a direct admit from [facility] emergency department.  Patient seen overnight 9 hours provider and patient was under the influence of a substance.  Per patient his UDS he tested positive for cocaine, THC, benzodiazepine.  Ethanol level was negative.  On initial presentation in the ED patient was mildly agitated provider who assessed patient through iris recommended transfer to [facility] observation unit as patient did not meet inpatient psychiatric treatment criteria.  Upon arrival here at [facility] patient has been observed sleeping most of the time since he has been here.  On assessment patient states that he is interested in housing and asked why he was directed to [facility].  Patient denies any suicidal, homicidal ideations reports that he occasionally hears voices however that resolved after he received a dose of olanzapine  last night.  Patient reports that he has been trying to get back on his medications but he does not have transportation to come to IOP or to the outpatient clinic.  Patient is interested in obtaining shelter resources as [place] and would like assistance with locating sheltered housing.  He also would like a refill of his psychiatric medications.  Patient  is observed initially sleeping however easily arousable.  Patient is alert, oriented x 4.  Patient's mood is euthymic, with a congruent affect.  Patient does not appear to be intoxicated or under the influence of any substance.  Objectively patient does not appear to be delusional or responding to any internal or external stimuli.  Patient's speech is clear, coherent and linear.  Patient denies any suicidal, homicidal, visual or auditory hallucinations at present.  Patient does not meet inpatient psychiatric treatment criteria we will request that social work assist with locating a bed at [facility] or [facility].  Flowsheet Row ED from 04/29/2024 in [facility] Emergency Department at [facility] ED from 04/24/2024 in [facility] Counselor from 02/04/2024 in [facility]  C-SSRS RISK CATEGORY No Risk No Risk Moderate Risk    Psychiatric Specialty Exam  Presentation  General Appearance:Fairly Groomed  Eye Contact:Minimal  Speech:Clear and Coherent  Speech Volume:Normal  Handedness:Right   Mood and Affect  Mood: Euthymic  Affect: Appropriate   Thought Process  Thought Processes: Linear  Descriptions of Associations:Intact  Orientation:Full (Time, Place and Person)  Thought Content:WDL  Diagnosis of Schizophrenia or Schizoaffective disorder in past: Yes  Duration of Psychotic Symptoms: Greater than six months  Hallucinations:Auditory Hears voices but resolved after taking Olanzipine  Ideas of Reference:None  Suicidal Thoughts:No  Homicidal Thoughts:No   Sensorium  Memory: Immediate Good; Remote Good; Recent Good  Judgment: Fair  Insight: Fair   Chartered certified accountant: Fair  Attention Span: Fair  Recall: Fair  Fund of Knowledge: Good  Language: Good   Psychomotor Activity  Psychomotor Activity:  Normal   Assets  Assets: Investment banker, corporate; Desire for Improvement   Sleep  Sleep: Good  Number of hours:  -- (8 hours last night)   Physical Exam: Physical Exam Constitutional:      General: He is not in acute distress.    Appearance: Normal appearance. He is not ill-appearing.  HENT:     Head: Normocephalic and atraumatic.     Nose: Nose normal.  Eyes:     Extraocular Movements: Extraocular movements intact.     Conjunctiva/sclera: Conjunctivae normal.     Pupils: Pupils are equal, round, and reactive to light.  Cardiovascular:     Rate and Rhythm: Normal rate and regular rhythm.  Pulmonary:     Effort: Pulmonary effort is normal.     Breath sounds: Normal breath sounds.  Musculoskeletal:     Cervical back: Normal range of motion and neck supple.  Skin:    General: Skin is warm and dry.  Neurological:     General: No focal deficit present.     Mental Status: He is alert and oriented to person, place, and time.     Review of Systems  Psychiatric/Behavioral:  Positive for substance abuse. Negative for depression, hallucinations and suicidal ideas. The patient is not nervous/anxious.    Blood pressure 117/87, pulse 63, temperature 97.6 F (36.4 C), temperature source Oral, resp. rate 18, SpO2 100%. There is no height or weight on file to calculate BMI.  Musculoskeletal: Strength & Muscle Tone: within normal limits Gait & Station: normal Patient leans: N/A   [facility] MSE Discharge Disposition for Follow up and Recommendations: Based on my evaluation the patient does not appear to have an emergency medical condition and can be discharged with resources and follow up care in outpatient services for medication management. Social worker  unavailable to assist patient with locating housing this Clinical research associate called [employer] [facility], [facility], [employer], and unsuccessfully was able to locate a bed for patient.  [place] rescue [facility] was unable to verify whether or not they had bed  placement as their coordinator was not answering the phone.  Patient was advised to continue calling [employer] today and was provided with a taxi voucher to the [facility] where he would have access to a telephone and provided with a parts bus pass as if he is excepted to [employer] he would need transportation in the [place] bus has a route to [employer] .  Patient was appreciative and was discharged with a 30-day supply of olanzapine  prescription.  Suzen Lesches, NP 05/01/2024, 5:23 PM

## 2024-04-30 NOTE — Discharge Summary (Signed)
 Matthew Carney to be discharged Home per NP order. Discussed with the patient and all questions fully answered. An After Visit Summary was printed and given to the patient. Patient escorted out, and discharged home via private auto.  [name]  04/30/2024 11:21 AM

## 2024-04-30 NOTE — ED Provider Notes (Signed)
 Patient has been accepted at [facility].   Raford Lenis, MD 04/30/24 [phone]

## 2024-04-30 NOTE — ED Notes (Signed)
TTS completed. 

## 2024-05-19 ENCOUNTER — Encounter (HOSPITAL_COMMUNITY): Payer: Self-pay

## 2024-05-19 ENCOUNTER — Ambulatory Visit (HOSPITAL_COMMUNITY): Payer: MEDICAID | Admitting: Licensed Clinical Social Worker
